# Patient Record
Sex: Male | Born: 1945 | Race: White | Hispanic: No | Smoking: Former smoker
Health system: Southern US, Community
[De-identification: ages and names within clinical notes are randomized; demographics above are authoritative.]

## PROBLEM LIST (undated history)

## (undated) DIAGNOSIS — R002 Palpitations: Secondary | ICD-10-CM

## (undated) DIAGNOSIS — M1990 Unspecified osteoarthritis, unspecified site: Secondary | ICD-10-CM

## (undated) DIAGNOSIS — I10 Essential (primary) hypertension: Secondary | ICD-10-CM

## (undated) DIAGNOSIS — F329 Major depressive disorder, single episode, unspecified: Secondary | ICD-10-CM

## (undated) DIAGNOSIS — E7800 Pure hypercholesterolemia, unspecified: Secondary | ICD-10-CM

## (undated) DIAGNOSIS — F32A Depression, unspecified: Secondary | ICD-10-CM

## (undated) HISTORY — DX: Palpitations: R00.2

## (undated) HISTORY — PX: VASECTOMY REVERSAL: SHX243

## (undated) HISTORY — PX: VASECTOMY: SHX75

## (undated) HISTORY — DX: Gilbert syndrome: E80.4

---

## 2001-06-25 ENCOUNTER — Other Ambulatory Visit: Admission: RE | Admit: 2001-06-25 | Discharge: 2001-06-25 | Payer: Self-pay | Admitting: Family Medicine

## 2001-06-25 ENCOUNTER — Encounter: Payer: Self-pay | Admitting: Family Medicine

## 2001-06-25 ENCOUNTER — Ambulatory Visit (HOSPITAL_COMMUNITY): Admission: RE | Admit: 2001-06-25 | Discharge: 2001-06-25 | Payer: Self-pay | Admitting: Family Medicine

## 2001-08-23 ENCOUNTER — Other Ambulatory Visit: Admission: RE | Admit: 2001-08-23 | Discharge: 2001-08-23 | Payer: Self-pay | Admitting: Dermatology

## 2002-05-26 HISTORY — DX: Gilbert syndrome: E80.4

## 2002-10-04 ENCOUNTER — Emergency Department (HOSPITAL_COMMUNITY): Admission: EM | Admit: 2002-10-04 | Discharge: 2002-10-04 | Payer: Self-pay | Admitting: *Deleted

## 2003-05-04 ENCOUNTER — Other Ambulatory Visit: Admission: RE | Admit: 2003-05-04 | Discharge: 2003-05-04 | Payer: Self-pay | Admitting: Dermatology

## 2003-07-10 ENCOUNTER — Emergency Department (HOSPITAL_COMMUNITY): Admission: EM | Admit: 2003-07-10 | Discharge: 2003-07-10 | Payer: Self-pay | Admitting: Emergency Medicine

## 2006-01-25 ENCOUNTER — Emergency Department (HOSPITAL_COMMUNITY): Admission: EM | Admit: 2006-01-25 | Discharge: 2006-01-25 | Payer: Self-pay | Admitting: Emergency Medicine

## 2009-05-28 ENCOUNTER — Ambulatory Visit (HOSPITAL_COMMUNITY): Admission: RE | Admit: 2009-05-28 | Discharge: 2009-05-28 | Payer: Self-pay | Admitting: Family Medicine

## 2009-06-05 ENCOUNTER — Ambulatory Visit (HOSPITAL_COMMUNITY): Admission: RE | Admit: 2009-06-05 | Discharge: 2009-06-05 | Payer: Self-pay | Admitting: Family Medicine

## 2011-05-30 ENCOUNTER — Ambulatory Visit (HOSPITAL_COMMUNITY)
Admission: RE | Admit: 2011-05-30 | Discharge: 2011-05-30 | Disposition: A | Discharge status: Home / Self Care | Payer: Medicare Other | Source: Ambulatory Visit | Attending: Family Medicine | Admitting: Family Medicine

## 2011-05-30 ENCOUNTER — Other Ambulatory Visit: Payer: Self-pay | Admitting: Family Medicine

## 2011-05-30 DIAGNOSIS — M25562 Pain in left knee: Secondary | ICD-10-CM

## 2011-05-30 DIAGNOSIS — M25569 Pain in unspecified knee: Secondary | ICD-10-CM | POA: Insufficient documentation

## 2011-06-07 ENCOUNTER — Emergency Department (HOSPITAL_COMMUNITY): Payer: Medicare Other

## 2011-06-07 ENCOUNTER — Encounter (HOSPITAL_COMMUNITY): Payer: Self-pay | Admitting: Emergency Medicine

## 2011-06-07 ENCOUNTER — Emergency Department (HOSPITAL_COMMUNITY)
Admission: EM | Admit: 2011-06-07 | Discharge: 2011-06-07 | Disposition: A | Discharge status: Home / Self Care | Payer: Medicare Other | Attending: Emergency Medicine | Admitting: Emergency Medicine

## 2011-06-07 DIAGNOSIS — M79672 Pain in left foot: Secondary | ICD-10-CM

## 2011-06-07 DIAGNOSIS — E7800 Pure hypercholesterolemia, unspecified: Secondary | ICD-10-CM | POA: Insufficient documentation

## 2011-06-07 DIAGNOSIS — Z862 Personal history of diseases of the blood and blood-forming organs and certain disorders involving the immune mechanism: Secondary | ICD-10-CM | POA: Insufficient documentation

## 2011-06-07 DIAGNOSIS — W19XXXA Unspecified fall, initial encounter: Secondary | ICD-10-CM

## 2011-06-07 DIAGNOSIS — S9030XA Contusion of unspecified foot, initial encounter: Secondary | ICD-10-CM | POA: Insufficient documentation

## 2011-06-07 DIAGNOSIS — F329 Major depressive disorder, single episode, unspecified: Secondary | ICD-10-CM | POA: Insufficient documentation

## 2011-06-07 DIAGNOSIS — M25562 Pain in left knee: Secondary | ICD-10-CM

## 2011-06-07 DIAGNOSIS — M7989 Other specified soft tissue disorders: Secondary | ICD-10-CM | POA: Insufficient documentation

## 2011-06-07 DIAGNOSIS — F3289 Other specified depressive episodes: Secondary | ICD-10-CM | POA: Insufficient documentation

## 2011-06-07 DIAGNOSIS — M25569 Pain in unspecified knee: Secondary | ICD-10-CM | POA: Insufficient documentation

## 2011-06-07 DIAGNOSIS — Z8639 Personal history of other endocrine, nutritional and metabolic disease: Secondary | ICD-10-CM | POA: Insufficient documentation

## 2011-06-07 DIAGNOSIS — J45909 Unspecified asthma, uncomplicated: Secondary | ICD-10-CM | POA: Insufficient documentation

## 2011-06-07 DIAGNOSIS — W1809XA Striking against other object with subsequent fall, initial encounter: Secondary | ICD-10-CM | POA: Insufficient documentation

## 2011-06-07 DIAGNOSIS — I10 Essential (primary) hypertension: Secondary | ICD-10-CM | POA: Insufficient documentation

## 2011-06-07 DIAGNOSIS — M79609 Pain in unspecified limb: Secondary | ICD-10-CM | POA: Insufficient documentation

## 2011-06-07 HISTORY — DX: Major depressive disorder, single episode, unspecified: F32.9

## 2011-06-07 HISTORY — DX: Depression, unspecified: F32.A

## 2011-06-07 HISTORY — DX: Essential (primary) hypertension: I10

## 2011-06-07 HISTORY — DX: Pure hypercholesterolemia, unspecified: E78.00

## 2011-06-07 MED ORDER — IBUPROFEN 800 MG PO TABS: 800.0000 mg | ORAL_TABLET | Freq: Three times a day (TID) | ORAL | Status: AC

## 2011-06-07 NOTE — ED Provider Notes (Signed)
History  Scribed for Donnetta Hutching, MD, the patient was seen in [facility]. The chart was scribed by Gilman Schmidt. The patients care was started at 11:35 AM.   CSN: [id number]  Arrival date & time 06/07/11  1016   First MD Initiated Contact with Patient 06/07/11 1043      Chief Complaint  Patient presents with  . Fall  . Foot Pain    (Consider location/radiation/quality/duration/timing/severity/associated sxs/prior treatment) HPI Travis Schmidt is a 66 y.o. male who presents to the Emergency Department complaining of left foot pain and swelling. Pt states he was sent by PCP to get XR. Reports that last night he was getting ready for bed and his knee gave out. States he then slammed left foot against wall. There are no other associated symptoms and no other alleviating or aggravating factors.   Past Medical History  Diagnosis Date  . Asthma   . Depression   . High cholesterol   . Hypertension   . Gout     Past Surgical History  Procedure Date  . Vasectomy   . Vasectomy reversal     Family History  Problem Relation Age of Onset  . Stroke Mother   . COPD Father   . Stroke Other     History  Substance Use Topics  . Smoking status: Current Some Day Smoker    Types: Cigars  . Smokeless tobacco: Never Used  . Alcohol Use: 6.0 oz/week    10 Cans of beer per week      Review of Systems  Musculoskeletal:       Foot Pain Foot Swelling  Skin: Negative for wound.  All other systems reviewed and are negative.    Allergies  Codeine and Penicillins  Home Medications  No current outpatient prescriptions on file.  BP 135/73  Pulse 82  Temp(Src) 97 F (36.1 C) (Oral)  Resp 21  Ht 6' (1.829 m)  Wt 220 lb (99.791 kg)  BMI 29.84 kg/m2  SpO2 97%  Physical Exam  Constitutional: He is oriented to person, place, and time. He appears well-developed and well-nourished.  Non-toxic appearance. He does not have a sickly appearance.  HENT:  Head: Normocephalic and  atraumatic.  Eyes: Lids are normal.  Neck: Trachea normal and full passive range of motion without pain.  Cardiovascular: Regular rhythm and normal heart sounds.   Pulmonary/Chest: Effort normal and breath sounds normal. No respiratory distress.  Abdominal: Soft. Normal appearance. He exhibits no distension. There is no tenderness. There is no rebound and no CVA tenderness.  Musculoskeletal: Normal range of motion.       Tenderness to right dorsum of foot Puffy and ecchymotic    Neurological: He is alert and oriented to person, place, and time. He has normal strength.  Skin: Skin is warm, dry and intact. No rash noted.  Psychiatric: He has a normal mood and affect.    ED Course  Procedures (including critical care time)  Labs Reviewed - No data to display No results found.   No diagnosis found.  DIAGNOSTIC STUDIES: Oxygen Saturation is 97% on room air, normal by my interpretation.    Radiology: DG Knee Complete 4 View. Reviewed by me. IMPRESSION: No acute finding. Original Report Authenticated By: Bernadene Bell. D'ALESSIO, M.D.  DG Foot Complete Left. Reviewed by me. IMPRESSION:  1. Soft tissue swelling. 2. No evidence for acute fracture.  Original Report Authenticated By: Patterson Hammersmith, M.D.     COORDINATION OF CARE: 11:35am:  -  Patient evaluated by ED physician,     MDM   X-ray of left foot and left knee were negative.  Knee immobilizer, firm sneaker for foot.  I personally performed the services described in this documentation, which was scribed in my presence. The recorded information has been reviewed and considered.        Donnetta Hutching, MD 06/07/11 1526

## 2011-06-07 NOTE — ED Notes (Signed)
Patient c/o left foot pain. Per patient fell yesterday and hit foot into wall.

## 2012-02-14 ENCOUNTER — Emergency Department (HOSPITAL_COMMUNITY): Payer: Medicare Other

## 2012-02-14 ENCOUNTER — Emergency Department (HOSPITAL_COMMUNITY)
Admission: EM | Admit: 2012-02-14 | Discharge: 2012-02-14 | Disposition: A | Discharge status: Home / Self Care | Payer: Medicare Other | Attending: Emergency Medicine | Admitting: Emergency Medicine

## 2012-02-14 ENCOUNTER — Encounter (HOSPITAL_COMMUNITY): Payer: Self-pay | Admitting: *Deleted

## 2012-02-14 DIAGNOSIS — F17200 Nicotine dependence, unspecified, uncomplicated: Secondary | ICD-10-CM | POA: Insufficient documentation

## 2012-02-14 DIAGNOSIS — W19XXXA Unspecified fall, initial encounter: Secondary | ICD-10-CM

## 2012-02-14 DIAGNOSIS — I10 Essential (primary) hypertension: Secondary | ICD-10-CM | POA: Insufficient documentation

## 2012-02-14 DIAGNOSIS — S32000A Wedge compression fracture of unspecified lumbar vertebra, initial encounter for closed fracture: Secondary | ICD-10-CM

## 2012-02-14 DIAGNOSIS — M109 Gout, unspecified: Secondary | ICD-10-CM | POA: Insufficient documentation

## 2012-02-14 DIAGNOSIS — S32009A Unspecified fracture of unspecified lumbar vertebra, initial encounter for closed fracture: Secondary | ICD-10-CM | POA: Insufficient documentation

## 2012-02-14 DIAGNOSIS — E7800 Pure hypercholesterolemia, unspecified: Secondary | ICD-10-CM | POA: Insufficient documentation

## 2012-02-14 DIAGNOSIS — W108XXA Fall (on) (from) other stairs and steps, initial encounter: Secondary | ICD-10-CM | POA: Insufficient documentation

## 2012-02-14 MED ORDER — OXYCODONE-ACETAMINOPHEN 5-325 MG PO TABS: 1.0000 | ORAL_TABLET | Freq: Four times a day (QID) | ORAL | Status: DC | PRN

## 2012-02-14 MED ORDER — HYDROMORPHONE HCL PF 2 MG/ML IJ SOLN
2.0000 mg | Freq: Once | INTRAMUSCULAR | Status: AC
  Administered 2012-02-14: 2 mg via INTRAMUSCULAR
  Filled 2012-02-14: qty 1

## 2012-02-14 MED ORDER — TETANUS-DIPHTH-ACELL PERTUSSIS 5-2.5-18.5 LF-MCG/0.5 IM SUSP
0.5000 mL | Freq: Once | INTRAMUSCULAR | Status: AC
  Administered 2012-02-14: 0.5 mL via INTRAMUSCULAR
  Filled 2012-02-14: qty 0.5

## 2012-02-14 MED ORDER — OXYCODONE-ACETAMINOPHEN 5-325 MG PO TABS: 2.0000 | ORAL_TABLET | ORAL | Status: DC | PRN

## 2012-02-14 NOTE — ED Notes (Addendum)
Pt fell on wet steps while coming down steps, pt states that he rolled down 11 steps, c/o pain to lower back, bilateral leg pain.

## 2012-02-14 NOTE — ED Notes (Signed)
Pt states fell down 11 stairs after slipping on wet deck steps. Pt presents with lower back pain, bilateral elbow abrasions and left shoulder abrasion. No bleeding noted. No deformities noted.  Pt ambulated to ED from home that is a short distance away without difficulty. Full ROM noted in all 4 extremities. Pulses equal and present x 4. Pt is alert, oriented x 4,

## 2012-02-14 NOTE — ED Provider Notes (Signed)
History   This chart was scribed for Shelda Jakes, MD scribed by Magnus Sinning. The patient was seen in room [facility] at 17:14   CSN: [id number]  Arrival date & time 02/14/12  1650   Chief Complaint  Patient presents with  . Fall    (Consider location/radiation/quality/duration/timing/severity/associated sxs/prior treatment) HPI  NYMIR RINGLER II is a 66 y.o. male who presents to the Emergency Department for EVAL following a fall that occurred this afternoon. Pt states he was heading into his home when he slipped and fell backwards onto his back. He says he slid down 11 wooden stairs causing pain to his bilateral lower mid back. Pt reports associated leg weakness, but says he ambulated to the ED with his cane, as he lives nearby. Patient rates current back pain a 9/10. He denies LOC, n/v, HA, chest pain, or abd pain. Pt reports hx of fall approximately 9 years ago in his home that caused back injury. He says Dr. Eduard Clos treated him with improvement. Patient says that he began having back pain at same location two weeks ago, which he says was improved with physical therapy equipment used in his home.  Patient unsure if tetanus is UTD.  Past Medical History  Diagnosis Date  . Depression   . High cholesterol   . Hypertension   . Gout     Past Surgical History  Procedure Date  . Vasectomy   . Vasectomy reversal     Family History  Problem Relation Age of Onset  . Stroke Mother   . COPD Father   . Stroke Other     History  Substance Use Topics  . Smoking status: Current Some Day Smoker    Types: Cigars  . Smokeless tobacco: Never Used  . Alcohol Use: 6.0 oz/week    10 Cans of beer per week      Review of Systems  Cardiovascular: Negative for chest pain.  Gastrointestinal: Negative for nausea, vomiting and abdominal pain.  Musculoskeletal: Positive for back pain.  Neurological: Negative for syncope and headaches.  All other systems reviewed and are  negative.    Allergies  Codeine and Penicillins  Home Medications   Current Outpatient Rx  Name Route Sig Dispense Refill  . ALPRAZOLAM 1 MG PO TABS Oral Take 1 mg by mouth at bedtime.    Marland Kitchen LIPITOR PO Oral Take 1 tablet by mouth every evening.     Marland Kitchen LEXAPRO PO Oral Take 1 tablet by mouth daily.     . TOPROL XL PO Oral Take 1 tablet by mouth daily.     . SEROQUEL PO Oral Take 1 capsule by mouth at bedtime.     . OXYCODONE-ACETAMINOPHEN 5-325 MG PO TABS Oral Take 2 tablets by mouth every 4 (four) hours as needed for pain. 6 tablet 0  . OXYCODONE-ACETAMINOPHEN 5-325 MG PO TABS Oral Take 1-2 tablets by mouth every 6 (six) hours as needed for pain. 15 tablet 0    BP 141/72  Pulse 79  Temp 98.7 F (37.1 C)  Resp 20  SpO2 97%  Physical Exam  Nursing note and vitals reviewed. Constitutional: He is oriented to person, place, and time. He appears well-developed and well-nourished. No distress.  HENT:  Head: Normocephalic and atraumatic.  Eyes: Conjunctivae normal and EOM are normal.  Neck: Neck supple. No tracheal deviation present.  Cardiovascular: Normal rate and regular rhythm.   No murmur heard. Pulmonary/Chest: Effort normal. No respiratory distress.  Lungs clear  Abdominal: Soft. Bowel sounds are normal. He exhibits no distension. There is no tenderness.  Musculoskeletal: Normal range of motion. He exhibits no edema.       Bilateral paraspinal tenderness  Neurological: He is alert and oriented to person, place, and time. No sensory deficit.  Skin: Skin is warm and dry.       Abrasions to back of both left and right elbows. Abrasions to both knees. No marks noted on the back.  Psychiatric: He has a normal mood and affect. His behavior is normal.    ED Course  Procedures (including critical care time) DIAGNOSTIC STUDIES: Oxygen Saturation is 97% on room airn, normal by my interpretation.    COORDINATION OF CARE:  17:20 Physical exam performed.  Medication  Orders 1730:TDaP (BOOSTRIX) injection 0.5 mL            HYDROmorphone (DILAUDID) injection 2 mg  Dg Lumbar Spine Complete  02/14/2012  *RADIOLOGY REPORT*  Clinical Data: Fall, low back pain  LUMBAR SPINE - COMPLETE 4+ VIEW  Comparison: Lumbar spine MRI dated 06/05/2009  Findings: Five lumbar-type vertebral bodies.  Normal lumbar lordosis.  Mild superior endplate compression deformity at L1, age indeterminate, but new from 2011.  No retropulsion.  No additional fracture is seen.  Mild multilevel degenerative changes.  Visualized bony pelvis appears intact.  IMPRESSION: Mild superior endplate compression deformity at L1, age indeterminate, but new from 2011.  Correlate with the site of the patient's pain to exclude acute fracture.   Original Report Authenticated By: Charline Bills, M.D.      1. Fall   2. Lumbar compression fracture       MDM  Workup consistent with lumbar compression fracture new since 2001 and may be related to this fall. Patient does have pain in that general area will treat with pain medicine and have patient continue to followup with Dr. Eduard Clos in spine clinic.  No significant neurological deficits.    I personally performed the services described in this documentation, which was scribed in my presence. The recorded information has been reviewed and considered.          Shelda Jakes, MD 02/14/12 [phone]

## 2012-02-16 ENCOUNTER — Other Ambulatory Visit (HOSPITAL_COMMUNITY): Payer: Self-pay | Admitting: Physical Medicine and Rehabilitation

## 2012-02-16 ENCOUNTER — Ambulatory Visit (HOSPITAL_COMMUNITY)
Admission: RE | Admit: 2012-02-16 | Discharge: 2012-02-16 | Disposition: A | Discharge status: Home / Self Care | Payer: Medicare Other | Source: Ambulatory Visit | Attending: Physical Medicine and Rehabilitation | Admitting: Physical Medicine and Rehabilitation

## 2012-02-16 DIAGNOSIS — IMO0002 Reserved for concepts with insufficient information to code with codable children: Secondary | ICD-10-CM

## 2012-02-16 DIAGNOSIS — S32009A Unspecified fracture of unspecified lumbar vertebra, initial encounter for closed fracture: Secondary | ICD-10-CM | POA: Insufficient documentation

## 2012-02-16 DIAGNOSIS — M545 Low back pain, unspecified: Secondary | ICD-10-CM | POA: Insufficient documentation

## 2012-02-16 DIAGNOSIS — M5126 Other intervertebral disc displacement, lumbar region: Secondary | ICD-10-CM | POA: Insufficient documentation

## 2012-02-16 DIAGNOSIS — M8440XA Pathological fracture, unspecified site, initial encounter for fracture: Secondary | ICD-10-CM

## 2012-02-16 DIAGNOSIS — W19XXXA Unspecified fall, initial encounter: Secondary | ICD-10-CM | POA: Insufficient documentation

## 2012-02-16 MED FILL — Oxycodone w/ Acetaminophen Tab 5-325 MG: ORAL | Qty: 6 | Status: AC

## 2012-05-31 ENCOUNTER — Encounter (HOSPITAL_COMMUNITY): Payer: Self-pay | Admitting: *Deleted

## 2012-05-31 ENCOUNTER — Emergency Department (HOSPITAL_COMMUNITY)
Admission: EM | Admit: 2012-05-31 | Discharge: 2012-05-31 | Disposition: A | Discharge status: Home / Self Care | Payer: Medicare PPO | Attending: Emergency Medicine | Admitting: Emergency Medicine

## 2012-05-31 DIAGNOSIS — F10929 Alcohol use, unspecified with intoxication, unspecified: Secondary | ICD-10-CM

## 2012-05-31 DIAGNOSIS — Z79899 Other long term (current) drug therapy: Secondary | ICD-10-CM | POA: Insufficient documentation

## 2012-05-31 DIAGNOSIS — F17200 Nicotine dependence, unspecified, uncomplicated: Secondary | ICD-10-CM | POA: Insufficient documentation

## 2012-05-31 DIAGNOSIS — E7800 Pure hypercholesterolemia, unspecified: Secondary | ICD-10-CM | POA: Insufficient documentation

## 2012-05-31 DIAGNOSIS — F329 Major depressive disorder, single episode, unspecified: Secondary | ICD-10-CM | POA: Insufficient documentation

## 2012-05-31 DIAGNOSIS — I10 Essential (primary) hypertension: Secondary | ICD-10-CM | POA: Insufficient documentation

## 2012-05-31 DIAGNOSIS — M109 Gout, unspecified: Secondary | ICD-10-CM | POA: Insufficient documentation

## 2012-05-31 DIAGNOSIS — F3289 Other specified depressive episodes: Secondary | ICD-10-CM | POA: Insufficient documentation

## 2012-05-31 DIAGNOSIS — F1010 Alcohol abuse, uncomplicated: Secondary | ICD-10-CM | POA: Insufficient documentation

## 2012-05-31 LAB — BASIC METABOLIC PANEL
BUN: 5 mg/dL — ABNORMAL LOW (ref 6–23)
CO2: 30 mEq/L (ref 19–32)
Calcium: 8.9 mg/dL (ref 8.4–10.5)
Chloride: 101 mEq/L (ref 96–112)
Creatinine, Ser: 0.9 mg/dL (ref 0.50–1.35)
GFR calc Af Amer: >90 mL/min (ref >90)
Potassium: 3.5 mEq/L (ref 3.5–5.1)

## 2012-05-31 LAB — RAPID URINE DRUG SCREEN, HOSP PERFORMED: Tetrahydrocannabinol: NOT DETECTED

## 2012-05-31 LAB — CBC
MCV: 103.9 fL — ABNORMAL HIGH (ref 78.0–100.0)
Platelets: 132 10*3/uL — ABNORMAL LOW (ref 150–400)
RBC: 4.08 MIL/uL — ABNORMAL LOW (ref 4.22–5.81)
WBC: 6.3 10*3/uL (ref 4.0–10.5)

## 2012-05-31 MED ORDER — VITAMIN B-1 100 MG PO TABS
100.0000 mg | ORAL_TABLET | Freq: Once | ORAL | Status: AC
  Administered 2012-05-31: 100 mg via ORAL
  Filled 2012-05-31: qty 1

## 2012-05-31 MED ORDER — FOLIC ACID 1 MG PO TABS
1.0000 mg | ORAL_TABLET | Freq: Once | ORAL | Status: AC
  Administered 2012-05-31: 1 mg via ORAL
  Filled 2012-05-31: qty 1

## 2012-05-31 MED ORDER — ADULT MULTIVITAMIN W/MINERALS CH
1.0000 | ORAL_TABLET | Freq: Once | ORAL | Status: AC
  Administered 2012-05-31: 1 via ORAL
  Filled 2012-05-31: qty 1

## 2012-05-31 NOTE — ED Notes (Signed)
Pt discharged. Pt stable at time of discharge. pt has no questions regarding discharge at this time. Pt voiced understanding of discharge instructions.  

## 2012-05-31 NOTE — ED Notes (Signed)
Contact numbers,  Asher Muir [phone] Erskine Squibb [phone]   Call for status reports.

## 2012-05-31 NOTE — ED Notes (Signed)
Pt arrived from home via ems d/t alcohol intoxication. Pt states he was sitting at home having a drink and his family was concerned pt was in danger. Pt arrives alert and aware. Pt answers all questions properly c/o of chronic back pain. No other complaints at this time.

## 2012-05-31 NOTE — ED Provider Notes (Signed)
History     CSN: [id number]  Arrival date & time 05/31/12  7829   First MD Initiated Contact with Patient 05/31/12 [phone]      Chief Complaint  Patient presents with  . Alcohol Intoxication    (Consider location/radiation/quality/duration/timing/severity/associated sxs/prior treatment) HPI Travis Schmidt is a 67 y.o. male brought in by ambulance, who presents to the Emergency Department complaining of alcohol abuse. EMS was called to the home due to an accidental pushing of a first alert button. Patient admitted to drinking and had pushed the button. He did not want to be evaluated at the hospital. His son, who is on the call list for first alert, called EMS to go to the home and pick up the patient and bring him to the ER to be checked. Both visits to the home required that EMS break into the home. The patient was reluctant to come to the ER. He admits to drinking "several" glasses of wine and taking his pain medicine. He does not want help with his drinking problem. He is not suicidal, homicidal and does not have AVH. Past Medical History  Diagnosis Date  . Depression   . High cholesterol   . Hypertension   . Gout     Past Surgical History  Procedure Date  . Vasectomy   . Vasectomy reversal     Family History  Problem Relation Age of Onset  . Stroke Mother   . COPD Father   . Stroke Other     History  Substance Use Topics  . Smoking status: Current Some Day Smoker    Types: Cigars  . Smokeless tobacco: Never Used  . Alcohol Use: 6.0 oz/week    10 Cans of beer per week      Review of Systems  Constitutional: Negative for fever.       10 Systems reviewed and are negative for acute change except as noted in the HPI.  HENT: Negative for congestion.   Eyes: Negative for discharge and redness.  Respiratory: Negative for cough and shortness of breath.   Cardiovascular: Negative for chest pain.  Gastrointestinal: Negative for vomiting and abdominal pain.    Musculoskeletal: Positive for back pain.  Skin: Negative for rash.  Neurological: Negative for syncope, numbness and headaches.  Psychiatric/Behavioral:       No behavior change.    Allergies  Codeine and Penicillins  Home Medications   Current Outpatient Rx  Name  Route  Sig  Dispense  Refill  . ALPRAZOLAM 1 MG PO TABS   Oral   Take 1 mg by mouth at bedtime.         Marland Kitchen LIPITOR PO   Oral   Take 1 tablet by mouth every evening.          Marland Kitchen LEXAPRO PO   Oral   Take 1 tablet by mouth daily.          . TOPROL XL PO   Oral   Take 1 tablet by mouth daily.          . OXYCODONE-ACETAMINOPHEN 5-325 MG PO TABS   Oral   Take 1-2 tablets by mouth every 6 (six) hours as needed for pain.   15 tablet   0   . OXYCODONE-ACETAMINOPHEN 5-325 MG PO TABS   Oral   Take 1-2 tablets by mouth every 6 (six) hours as needed for pain.   6 tablet   0   . SEROQUEL PO   Oral  Take 1 capsule by mouth at bedtime.            BP 127/69  Pulse 71  Temp 97.6 F (36.4 C) (Oral)  Resp 18  Ht 6' (1.829 m)  Wt 220 lb (99.791 kg)  BMI 29.84 kg/m2  SpO2 95%  Physical Exam  Nursing note and vitals reviewed. Constitutional: He is oriented to person, place, and time.       Awake, alert,disheveled, elderly man, intoxicated  HENT:  Head: Atraumatic.  Eyes: Right eye exhibits no discharge. Left eye exhibits no discharge.  Neck: Neck supple.  Cardiovascular: Normal heart sounds.   Pulmonary/Chest: Effort normal and breath sounds normal. He exhibits no tenderness.  Abdominal: Soft. There is no tenderness. There is no rebound.  Musculoskeletal: He exhibits no tenderness.       Baseline ROM, no obvious new focal weakness.  Neurological: He is alert and oriented to person, place, and time. He has normal reflexes. No cranial nerve deficit.       Mental status and motor strength appears baseline for patient and situation.  Skin: No rash noted.  Psychiatric: He has a normal mood and  affect.    ED Course  Procedures (including critical care time)  Results for orders placed during the hospital encounter of 05/31/12  ETHANOL      Component Value Range   Alcohol, Ethyl (B) 174 (*) 0 - 11 mg/dL  BASIC METABOLIC PANEL      Component Value Range   Sodium 140  135 - 145 mEq/L   Potassium 3.5  3.5 - 5.1 mEq/L   Chloride 101  96 - 112 mEq/L   CO2 30  19 - 32 mEq/L   Glucose, Bld 103 (*) 70 - 99 mg/dL   BUN 5 (*) 6 - 23 mg/dL   Creatinine, Ser 1.61  0.50 - 1.35 mg/dL   Calcium 8.9  8.4 - 09.6 mg/dL   GFR calc non Af Amer 85 (*) >90 mL/min   GFR calc Af Amer >90  >90 mL/min  CBC      Component Value Range   WBC 6.3  4.0 - 10.5 K/uL   RBC 4.08 (*) 4.22 - 5.81 MIL/uL   Hemoglobin 14.8  13.0 - 17.0 g/dL   HCT 04.5  40.9 - 81.1 %   MCV 103.9 (*) 78.0 - 100.0 fL   MCH 36.3 (*) 26.0 - 34.0 pg   MCHC 34.9  30.0 - 36.0 g/dL   RDW 91.4  78.2 - 95.6 %   Platelets 132 (*) 150 - 400 K/uL  URINE RAPID DRUG SCREEN (HOSP PERFORMED)      Component Value Range   Opiates NONE DETECTED  NONE DETECTED   Cocaine NONE DETECTED  NONE DETECTED   Benzodiazepines NONE DETECTED  NONE DETECTED   Amphetamines NONE DETECTED  NONE DETECTED   Tetrahydrocannabinol NONE DETECTED  NONE DETECTED   Barbiturates NONE DETECTED  NONE DETECTED       MDM  Patient presents with alcohol intoxication, no c/o. He is here at the urging of his son. He has no c/o. Labs are unremarkable. ETOH is 174. He does not want help with his alcohol use. He will be discharged home. Pt stable in ED with no significant deterioration in condition.The patient appears reasonably screened and/or stabilized for discharge and I doubt any other medical condition or other [facility] requiring further screening, evaluation, or treatment in the ED at this time prior to discharge.  MDM Reviewed: nursing  note and vitals Interpretation: labs           Nicoletta Dress. Colon Branch, MD 05/31/12 [phone]

## 2012-11-18 ENCOUNTER — Other Ambulatory Visit: Payer: Self-pay | Admitting: Family Medicine

## 2012-11-18 NOTE — Telephone Encounter (Signed)
Needs office visit.

## 2012-11-22 ENCOUNTER — Encounter: Payer: Self-pay | Admitting: *Deleted

## 2012-12-02 ENCOUNTER — Other Ambulatory Visit: Payer: Self-pay | Admitting: Family Medicine

## 2013-03-14 ENCOUNTER — Other Ambulatory Visit: Payer: Self-pay | Admitting: Family Medicine

## 2013-03-14 NOTE — Telephone Encounter (Signed)
May refill x1. This patient also needs lab work. He also needs followup office visit. Please let me see his chart and I will inform the nurses what to order

## 2013-04-26 ENCOUNTER — Other Ambulatory Visit: Payer: Self-pay | Admitting: Family Medicine

## 2013-05-14 ENCOUNTER — Other Ambulatory Visit: Payer: Self-pay | Admitting: Family Medicine

## 2013-05-31 ENCOUNTER — Other Ambulatory Visit: Payer: Self-pay | Admitting: Family Medicine

## 2013-06-01 ENCOUNTER — Encounter: Payer: Self-pay | Admitting: Family Medicine

## 2013-06-01 ENCOUNTER — Ambulatory Visit (INDEPENDENT_AMBULATORY_CARE_PROVIDER_SITE_OTHER): Payer: Medicare PPO | Admitting: Family Medicine

## 2013-06-01 VITALS — BP 110/64 | HR 70 | Ht 71.0 in | Wt 227.0 lb

## 2013-06-01 DIAGNOSIS — Z0000 Encounter for general adult medical examination without abnormal findings: Secondary | ICD-10-CM

## 2013-06-01 DIAGNOSIS — Z79899 Other long term (current) drug therapy: Secondary | ICD-10-CM

## 2013-06-01 DIAGNOSIS — M2550 Pain in unspecified joint: Secondary | ICD-10-CM

## 2013-06-01 DIAGNOSIS — E782 Mixed hyperlipidemia: Secondary | ICD-10-CM

## 2013-06-01 DIAGNOSIS — M549 Dorsalgia, unspecified: Secondary | ICD-10-CM

## 2013-06-01 DIAGNOSIS — R7301 Impaired fasting glucose: Secondary | ICD-10-CM

## 2013-06-01 MED ORDER — DICLOFENAC SODIUM 75 MG PO TBEC: 75.0000 mg | DELAYED_RELEASE_TABLET | Freq: Two times a day (BID) | ORAL | Status: DC

## 2013-06-01 NOTE — Progress Notes (Signed)
   Subjective:    Patient ID: Travis Schmidt, male    DOB: [birth year], 68 y.o.   MRN: [id number]  HPI AWV- Annual Wellness Visit  The patient was seen for their annual wellness visit. The patient's past medical history, surgical history, and family history were reviewed. Pertinent vaccines were reviewed ( tetanus, pneumonia, shingles, flu) The patient's medication list was reviewed and updated. The height and weight were entered. The patient's current BMI is: 31.66  Cognitive screening was completed. Outcome of Mini - Cog: Pass  Falls within the past 6 months: None Current tobacco usage: cigars some days (All patients who use tobacco were given written and verbal information on quitting)  Recent listing of emergency department/hospitalizations over the past year were reviewed.  current specialist the patient sees on a regular basis: ER record was reviewed please see electronic record  Medicare annual wellness visit patient questionnaire was reviewed.  A written screening schedule for the patient for the next 5-10 years was given. Appropriate discussion of followup regarding next visit was discussed.       Review of Systems  Constitutional: Negative for fever, activity change and appetite change.  HENT: Negative for congestion and rhinorrhea.   Eyes: Negative for discharge.  Respiratory: Negative for cough and wheezing.   Cardiovascular: Negative for chest pain.  Gastrointestinal: Negative for vomiting, abdominal pain and blood in stool.  Genitourinary: Negative for frequency and difficulty urinating.  Musculoskeletal: Negative for neck pain.  Skin: Negative for rash.  Allergic/Immunologic: Negative for environmental allergies and food allergies.  Neurological: Negative for weakness and headaches.  Psychiatric/Behavioral: Negative for agitation.       Objective:   Physical Exam  Constitutional: He appears well-developed and well-nourished.  HENT:  Head: Normocephalic  and atraumatic.  Right Ear: External ear normal.  Left Ear: External ear normal.  Nose: Nose normal.  Mouth/Throat: Oropharynx is clear and moist.  Neck: Normal range of motion. Neck supple. No thyromegaly present.  Cardiovascular: Normal rate, regular rhythm and normal heart sounds.   No murmur heard. Pulmonary/Chest: Effort normal and breath sounds normal. No respiratory distress. He has no wheezes.  Abdominal: Soft. Bowel sounds are normal. He exhibits no distension and no mass. There is no tenderness.  Musculoskeletal: Normal range of motion. He exhibits no edema.  Lymphadenopathy:    He has no cervical adenopathy.  Neurological: He is alert. He exhibits normal muscle tone.  Skin: Skin is warm and dry. No erythema.  Psychiatric: He has a normal mood and affect. His behavior is normal. Judgment normal.          Assessment & Plan:  #1 back arthralgia-x-rays ordered. Try Voltaren twice a day if this does not help we might consider Mobic. He will let us know in a few weeks #2 wellness-patient defers on prostate exam and colonoscopy. He states he is very comfortable with knowing that he has lived a good life and he does not want to have any type of cancer detection tests or other preventative measures done at this time #3 hyperlipidemia -- will monitor her lipid liver profile is yearly #4 patient will followup in 6 months regarding cholesterol

## 2013-06-18 ENCOUNTER — Emergency Department (HOSPITAL_COMMUNITY)
Admission: EM | Admit: 2013-06-18 | Discharge: 2013-06-18 | Disposition: A | Discharge status: Home / Self Care | Payer: Medicare PPO | Attending: Emergency Medicine | Admitting: Emergency Medicine

## 2013-06-18 ENCOUNTER — Emergency Department (HOSPITAL_COMMUNITY): Payer: Medicare PPO

## 2013-06-18 ENCOUNTER — Encounter (HOSPITAL_COMMUNITY): Payer: Self-pay | Admitting: Emergency Medicine

## 2013-06-18 DIAGNOSIS — F17200 Nicotine dependence, unspecified, uncomplicated: Secondary | ICD-10-CM | POA: Insufficient documentation

## 2013-06-18 DIAGNOSIS — F329 Major depressive disorder, single episode, unspecified: Secondary | ICD-10-CM | POA: Insufficient documentation

## 2013-06-18 DIAGNOSIS — J111 Influenza due to unidentified influenza virus with other respiratory manifestations: Secondary | ICD-10-CM

## 2013-06-18 DIAGNOSIS — I10 Essential (primary) hypertension: Secondary | ICD-10-CM | POA: Insufficient documentation

## 2013-06-18 DIAGNOSIS — J9801 Acute bronchospasm: Secondary | ICD-10-CM | POA: Insufficient documentation

## 2013-06-18 DIAGNOSIS — Z79899 Other long term (current) drug therapy: Secondary | ICD-10-CM | POA: Insufficient documentation

## 2013-06-18 DIAGNOSIS — E7800 Pure hypercholesterolemia, unspecified: Secondary | ICD-10-CM | POA: Insufficient documentation

## 2013-06-18 DIAGNOSIS — Z791 Long term (current) use of non-steroidal anti-inflammatories (NSAID): Secondary | ICD-10-CM | POA: Insufficient documentation

## 2013-06-18 DIAGNOSIS — F3289 Other specified depressive episodes: Secondary | ICD-10-CM | POA: Insufficient documentation

## 2013-06-18 DIAGNOSIS — Z880 Allergy status to penicillin: Secondary | ICD-10-CM | POA: Insufficient documentation

## 2013-06-18 DIAGNOSIS — R062 Wheezing: Secondary | ICD-10-CM | POA: Insufficient documentation

## 2013-06-18 LAB — BASIC METABOLIC PANEL
BUN: 25 mg/dL — AB (ref 6–23)
CO2: 23 meq/L (ref 19–32)
Calcium: 9.2 mg/dL (ref 8.4–10.5)
Chloride: 103 mEq/L (ref 96–112)
Creatinine, Ser: 1.39 mg/dL — ABNORMAL HIGH (ref 0.50–1.35)
GFR calc Af Amer: 58 mL/min — ABNORMAL LOW (ref >90)
GFR calc non Af Amer: 50 mL/min — ABNORMAL LOW (ref >90)
GLUCOSE: 134 mg/dL — AB (ref 70–99)
POTASSIUM: 4.2 meq/L (ref 3.7–5.3)
Sodium: 138 mEq/L (ref 137–147)

## 2013-06-18 LAB — CBC WITH DIFFERENTIAL/PLATELET
BASOS PCT: 1 % (ref 0–1)
Basophils Absolute: 0 10*3/uL (ref 0.0–0.1)
EOS ABS: 0.1 10*3/uL (ref 0.0–0.7)
EOS PCT: 1 % (ref 0–5)
HCT: 41.6 % (ref 39.0–52.0)
Hemoglobin: 14.2 g/dL (ref 13.0–17.0)
Lymphocytes Relative: 13 % (ref 12–46)
Lymphs Abs: 0.6 10*3/uL — ABNORMAL LOW (ref 0.7–4.0)
MCH: 32.6 pg (ref 26.0–34.0)
MCHC: 34.1 g/dL (ref 30.0–36.0)
MCV: 95.4 fL (ref 78.0–100.0)
MONO ABS: 0.5 10*3/uL (ref 0.1–1.0)
Monocytes Relative: 10 % (ref 3–12)
Neutro Abs: 3.8 10*3/uL (ref 1.7–7.7)
Neutrophils Relative %: 75 % (ref 43–77)
Platelets: 131 10*3/uL — ABNORMAL LOW (ref 150–400)
RBC: 4.36 MIL/uL (ref 4.22–5.81)
RDW: 14.3 % (ref 11.5–15.5)
WBC: 5.1 10*3/uL (ref 4.0–10.5)

## 2013-06-18 LAB — CG4 I-STAT (LACTIC ACID): Lactic Acid, Venous: 0.82 mmol/L (ref 0.5–2.2)

## 2013-06-18 MED ORDER — PREDNISONE 50 MG PO TABS
60.0000 mg | ORAL_TABLET | Freq: Once | ORAL | Status: AC
  Administered 2013-06-18: 60 mg via ORAL
  Filled 2013-06-18 (×2): qty 1

## 2013-06-18 MED ORDER — ALBUTEROL SULFATE HFA 108 (90 BASE) MCG/ACT IN AERS: 2.0000 | INHALATION_SPRAY | RESPIRATORY_TRACT | Status: DC | PRN

## 2013-06-18 MED ORDER — PREDNISONE 20 MG PO TABS: ORAL_TABLET | ORAL | Status: DC

## 2013-06-18 MED ORDER — IPRATROPIUM-ALBUTEROL 0.5-2.5 (3) MG/3ML IN SOLN
3.0000 mL | RESPIRATORY_TRACT | Status: DC
Start: 2013-06-18 — End: 2013-06-18
  Administered 2013-06-18: 3 mL via RESPIRATORY_TRACT
  Filled 2013-06-18: qty 3

## 2013-06-18 MED ORDER — SODIUM CHLORIDE 0.9 % IV BOLUS (SEPSIS)
2000.0000 mL | Freq: Once | INTRAVENOUS | Status: AC
  Administered 2013-06-18: 2000 mL via INTRAVENOUS

## 2013-06-18 MED ORDER — SODIUM CHLORIDE 0.9 % IV BOLUS (SEPSIS): 1000.0000 mL | Freq: Once | INTRAVENOUS | Status: DC

## 2013-06-18 MED ORDER — SODIUM CHLORIDE 0.9 % IV SOLN: INTRAVENOUS | Status: DC

## 2013-06-18 NOTE — Discharge Instructions (Signed)
Antibiotic Nonuse ° Your caregiver felt that the infection or problem was not one that would be helped with an antibiotic. °Infections may be caused by viruses or bacteria. Only a caregiver can tell which one of these is the likely cause of an illness. A cold is the most common cause of infection in both adults and children. A cold is a virus. Antibiotic treatment will have no effect on a viral infection. Viruses can lead to many lost days of work caring for sick children and many missed days of school. Children may catch as many as 10 "colds" or "flus" per year during which they can be tearful, cranky, and uncomfortable. The goal of treating a virus is aimed at keeping the ill person comfortable. °Antibiotics are medications used to help the body fight bacterial infections. There are relatively few types of bacteria that cause infections but there are hundreds of viruses. While both viruses and bacteria cause infection they are very different types of germs. A viral infection will typically go away by itself within 7 to 10 days. Bacterial infections may spread or get worse without antibiotic treatment. °Examples of bacterial infections are: °· Sore throats (like strep throat or tonsillitis). °· Infection in the lung (pneumonia). °· Ear and skin infections. °Examples of viral infections are: °· Colds or flus. °· Most coughs and bronchitis. °· Sore throats not caused by Strep. °· Runny noses. °It is often best not to take an antibiotic when a viral infection is the cause of the problem. Antibiotics can kill off the helpful bacteria that we have inside our body and allow harmful bacteria to start growing. Antibiotics can cause side effects such as allergies, nausea, and diarrhea without helping to improve the symptoms of the viral infection. Additionally, repeated uses of antibiotics can cause bacteria inside of our body to become resistant. That resistance can be passed onto harmful bacterial. The next time you have  an infection it may be harder to treat if antibiotics are used when they are not needed. Not treating with antibiotics allows our own immune system to develop and take care of infections more efficiently. Also, antibiotics will work better for us when they are prescribed for bacterial infections. °Treatments for a child that is ill may include: °· Give extra fluids throughout the day to stay hydrated. °· Get plenty of rest. °· Only give your child over-the-counter or prescription medicines for pain, discomfort, or fever as directed by your caregiver. °· The use of a cool mist humidifier may help stuffy noses. °· Cold medications if suggested by your caregiver. °Your caregiver may decide to start you on an antibiotic if: °· The problem you were seen for today continues for a longer length of time than expected. °· You develop a secondary bacterial infection. °SEEK MEDICAL CARE IF: °· Fever lasts longer than 5 days. °· Symptoms continue to get worse after 5 to 7 days or become severe. °· Difficulty in breathing develops. °· Signs of dehydration develop (poor drinking, rare urinating, dark colored urine). °· Changes in behavior or worsening tiredness (listlessness or lethargy). °Document Released: 07/21/2001 Document Revised: 08/04/2011 Document Reviewed: 01/17/2009 °[employer]® Patient Information ©2014 [employer]. °You appear to have an upper respiratory infection (URI). An upper respiratory tract infection, or cold, is a viral infection of the air passages leading to the lungs. It is contagious and can be spread to others, especially during the first 3 or 4 days. It cannot be cured by antibiotics or other medicines. °RETURN IMMEDIATELY   IF you develop shortness of breath, confusion or altered mental status, a new rash, become dizzy, faint, or poorly responsive, or are unable to be cared for at home. °

## 2013-06-18 NOTE — ED Notes (Signed)
Pt c/o cough, generalized body aches, fever. Pt denies n/v.

## 2013-06-18 NOTE — ED Provider Notes (Signed)
CSN: [id number]     Arrival date & time 06/18/13  1039 History  This chart was scribed for Hurman Horn, MD by Shari Heritage, ED Scribe. The patient was seen in room APA05/APA05. Patient's care was started at 11:17 AM.    Chief Complaint  Patient presents with  . Influenza    The history is provided by the patient. No language interpreter was used.    HPI Comments: Travis Schmidt is a 68 y.o. male who presents to the Emergency Department complaining of constant, productive cough that began 2 days ago. Cough is productive of green-gray sputum and is worse with exertion. There is associated fever (Tmax 103 last night), body aches and mild dyspnea on exertion. He denies leg pain or swelling, visual changes, abdominal pain, hearing loss or difficulty swallowing. There is no nausea or vomiting. He has a medical history of high cholesterol, hypertension (treated with metoprolol), gout, palpitations.  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 He smokes 1 cigar daily.    Past Medical History  Diagnosis Date  . Depression   . High cholesterol   . Hypertension   . Gout   . Palpitations   . [place] syndrome 2004   Past Surgical History  Procedure Laterality Date  . Vasectomy    . Vasectomy reversal     Family History  Problem Relation Age of Onset  . Stroke Mother   . Heart attack Mother   . COPD Father   . Stroke Other    History  Substance Use Topics  . Smoking status: Current Some Day Smoker    Types: Cigars  . Smokeless tobacco: Never Used  . Alcohol Use: No    Review of Systems 10 Systems reviewed and all are negative for acute change except as noted in the HPI.   Allergies  Codeine and Penicillins  Home Medications   Current Outpatient Rx  Name  Route  Sig  Dispense  Refill  . ALPRAZolam (XANAX) 1 MG tablet   Oral   Take 1 mg by mouth at bedtime.         Marland Kitchen aspirin 325 MG tablet   Oral   Take 650 mg by mouth every 6 (six) hours  as needed for fever.         Marland Kitchen atorvastatin (LIPITOR) 80 MG tablet   Oral   Take 1 tablet (80 mg total) by mouth daily. NEEDS OFFICE VISIT   30 tablet   0   . diclofenac (VOLTAREN) 75 MG EC tablet   Oral   Take 1 tablet (75 mg total) by mouth 2 (two) times daily.   60 tablet   1   . escitalopram (LEXAPRO) 20 MG tablet   Oral   Take 20 mg by mouth daily.         . metoprolol succinate (TOPROL-XL) 100 MG 24 hr tablet      TAKE 1 TABLET DAILY.   30 tablet   0     Please schedule office visit   . QUEtiapine (SEROQUEL) 300 MG tablet   Oral   Take 300 mg by mouth at bedtime.         Marland Kitchen albuterol (PROVENTIL HFA;VENTOLIN HFA) 108 (90 BASE) MCG/ACT inhaler   Inhalation   Inhale 2 puffs into the lungs every 2 (two) hours as needed for wheezing or shortness of breath (cough).   1 Inhaler   0   .  predniSONE (DELTASONE) 20 MG tablet      2 tabs po daily x 4 days   8 tablet   0    Triage Vitals: BP 89/45  Pulse 81  Temp(Src) 98.4 F (36.9 C) (Oral)  Resp 20  Ht 6' (1.829 m)  Wt 210 lb (95.255 kg)  BMI 28.47 kg/m2  SpO2 96% Physical Exam  Nursing note and vitals reviewed. Constitutional:  Awake, alert, nontoxic appearance.  HENT:  Head: Atraumatic.  Eyes: Right eye exhibits no discharge. Left eye exhibits no discharge.  Neck: Neck supple.  Cardiovascular: Normal rate, regular rhythm and normal heart sounds.   No murmur heard. Pulmonary/Chest: Effort normal. No respiratory distress. He has wheezes (mild, scattered, expiratory). He has no rales. He exhibits no tenderness.  No accessory muscle usage. No retractions. No crackles. Pulse oximetry normal on room air. No rhonchi.   Abdominal: Soft. There is no tenderness. There is no rebound.  Musculoskeletal: He exhibits no tenderness.  Baseline ROM, no obvious new focal weakness.  Neurological:  Mental status and motor strength appears baseline for patient and situation.  Skin: No rash noted.  Psychiatric: He has  a normal mood and affect.    ED Course  Procedures (including critical care time) DIAGNOSTIC STUDIES: Oxygen Saturation is 96% on room air, adequate by my interpretation.    COORDINATION OF CARE: 11:20 AM- Patient understand and agree with initial ED impression and plan with expectations set for ED visit.  Patient sitting up asymptomatic was able to stand for xray also without lightheadedness, but still has slight hypotension with systolic blood pressure in the high 80s after 1 L fluid bolus, clinically doubt sepsis but will continue IVF. 1325  SBP>100 and Pt wants discharge after 2L IVF.  Results for orders placed during the hospital encounter of 06/18/13  BASIC METABOLIC PANEL      Result Value Range   Sodium 138  137 - 147 mEq/L   Potassium 4.2  3.7 - 5.3 mEq/L   Chloride 103  96 - 112 mEq/L   CO2 23  19 - 32 mEq/L   Glucose, Bld 134 (*) 70 - 99 mg/dL   BUN 25 (*) 6 - 23 mg/dL   Creatinine, Ser 8.111.39 (*) 0.50 - 1.35 mg/dL   Calcium 9.2  8.4 - 91.410.5 mg/dL   GFR calc non Af Amer 50 (*) >90 mL/min   GFR calc Af Amer 58 (*) >90 mL/min  CBC WITH DIFFERENTIAL      Result Value Range   WBC 5.1  4.0 - 10.5 K/uL   RBC 4.36  4.22 - 5.81 MIL/uL   Hemoglobin 14.2  13.0 - 17.0 g/dL   HCT 78.241.6  95.639.0 - 21.352.0 %   MCV 95.4  78.0 - 100.0 fL   MCH 32.6  26.0 - 34.0 pg   MCHC 34.1  30.0 - 36.0 g/dL   RDW 08.614.3  57.811.5 - 46.915.5 %   Platelets 131 (*) 150 - 400 K/uL   Neutrophils Relative % 75  43 - 77 %   Neutro Abs 3.8  1.7 - 7.7 K/uL   Lymphocytes Relative 13  12 - 46 %   Lymphs Abs 0.6 (*) 0.7 - 4.0 K/uL   Monocytes Relative 10  3 - 12 %   Monocytes Absolute 0.5  0.1 - 1.0 K/uL   Eosinophils Relative 1  0 - 5 %   Eosinophils Absolute 0.1  0.0 - 0.7 K/uL   Basophils Relative 1  0 -  1 %   Basophils Absolute 0.0  0.0 - 0.1 K/uL  CG4 I-STAT (LACTIC ACID)      Result Value Range   Lactic Acid, Venous 0.82  0.5 - 2.2 mmol/L     Imaging Review Dg Chest 2 View  06/18/2013   CLINICAL DATA:   Cough and congestion  EXAM: CHEST  2 VIEW  COMPARISON:  07/10/2003  FINDINGS: Normal heart size. Bronchitic changes. Hyperaeration. No pneumothorax. No pleural effusion.  IMPRESSION: No active cardiopulmonary disease.   Electronically Signed   By: Maryclare Bean M.D.   On: 06/18/2013 12:22    EKG Interpretation   None       MDM   1. Influenza   2. Bronchospasm    I doubt any other EMC precluding discharge at this time including, but not necessarily limited to the following:sepsis.  I personally performed the services described in this documentation, which was scribed in my presence. The recorded information has been reviewed and is accurate.    Hurman Horn, MD 06/18/13 [id number]

## 2013-06-20 ENCOUNTER — Ambulatory Visit (INDEPENDENT_AMBULATORY_CARE_PROVIDER_SITE_OTHER): Payer: Medicare PPO | Admitting: Family Medicine

## 2013-06-20 ENCOUNTER — Encounter: Payer: Self-pay | Admitting: Family Medicine

## 2013-06-20 VITALS — BP 112/64 | Temp 98.5°F | Ht 72.0 in | Wt 231.0 lb

## 2013-06-20 DIAGNOSIS — J45909 Unspecified asthma, uncomplicated: Secondary | ICD-10-CM

## 2013-06-20 DIAGNOSIS — J111 Influenza due to unidentified influenza virus with other respiratory manifestations: Secondary | ICD-10-CM

## 2013-06-20 DIAGNOSIS — J209 Acute bronchitis, unspecified: Secondary | ICD-10-CM

## 2013-06-20 MED ORDER — ALBUTEROL SULFATE HFA 108 (90 BASE) MCG/ACT IN AERS
2.0000 | INHALATION_SPRAY | RESPIRATORY_TRACT | Status: DC | PRN
Start: 2013-06-20 — End: 2014-01-20

## 2013-06-20 MED ORDER — ALBUTEROL SULFATE HFA 108 (90 BASE) MCG/ACT IN AERS: 2.0000 | INHALATION_SPRAY | RESPIRATORY_TRACT | Status: DC | PRN

## 2013-06-20 MED ORDER — DOXYCYCLINE HYCLATE 100 MG PO CAPS: 100.0000 mg | ORAL_CAPSULE | Freq: Two times a day (BID) | ORAL | Status: DC

## 2013-06-20 NOTE — Progress Notes (Signed)
   Subjective:    Patient ID: Travis Schmidt, male    DOB: [birth year], 68 y.o.   MRN: [id number]  Cough This is a new problem. The current episode started in the past 7 days. Associated symptoms include wheezing. Associated symptoms comments: congestion.   patient had flulike illness with compounding congestion and coughing over the past few days went to the ER and Saturday these notes and chest x-ray were reviewed. Went to ED on 01/24. Diagnosed with flu. Prescribed albuteral and prednisione.  Patient relates a little bit of shortness of breath with walking but he states when he sitting he is not short of breath. He does state he wheezes quite a bit at nighttime.  Review of Systems  Respiratory: Positive for cough and wheezing.    no vomiting or diarrhea.     Objective:   Physical Exam He does have bilateral expiratory wheezes not rest for distress he does have some chest congestion but no true crackles heart is regular extremities no edema skin warm dry       Assessment & Plan:  #1 the flu #2 post-influenza pneumonia antibiotics prescribed warning signs discussed if worse over the next 24-48 hours to followup

## 2013-06-20 NOTE — Addendum Note (Signed)
Addended by: Lilyan PuntLUKING, Yuan Gann A on: 06/20/2013 02:17 PM   Modules accepted: Orders

## 2013-07-02 ENCOUNTER — Other Ambulatory Visit: Payer: Self-pay | Admitting: Family Medicine

## 2013-08-01 ENCOUNTER — Other Ambulatory Visit: Payer: Self-pay | Admitting: Family Medicine

## 2013-08-11 ENCOUNTER — Other Ambulatory Visit: Payer: Self-pay | Admitting: Family Medicine

## 2013-12-07 ENCOUNTER — Other Ambulatory Visit: Payer: Self-pay | Admitting: Family Medicine

## 2013-12-07 NOTE — Telephone Encounter (Signed)
Last seen 06/20/13

## 2014-01-03 ENCOUNTER — Other Ambulatory Visit: Payer: Self-pay | Admitting: Family Medicine

## 2014-01-20 ENCOUNTER — Ambulatory Visit (INDEPENDENT_AMBULATORY_CARE_PROVIDER_SITE_OTHER): Payer: Medicare PPO | Admitting: Family Medicine

## 2014-01-20 ENCOUNTER — Encounter: Payer: Self-pay | Admitting: Family Medicine

## 2014-01-20 VITALS — BP 120/78 | Temp 98.4°F | Ht 72.0 in | Wt 242.0 lb

## 2014-01-20 DIAGNOSIS — Z23 Encounter for immunization: Secondary | ICD-10-CM

## 2014-01-20 DIAGNOSIS — J069 Acute upper respiratory infection, unspecified: Secondary | ICD-10-CM

## 2014-01-20 MED ORDER — DOXYCYCLINE HYCLATE 100 MG PO CAPS: 100.0000 mg | ORAL_CAPSULE | Freq: Two times a day (BID) | ORAL | Status: DC

## 2014-01-20 NOTE — Progress Notes (Signed)
   Subjective:    Patient ID: Travis Schmidt, male    DOB: February 13, 1946, 68 y.o.   MRN: [id number]  Cough This is a new problem. Associated symptoms include rhinorrhea. Pertinent negatives include no chest pain, ear pain, fever, nasal congestion or wheezing. He has tried prescription cough suppressant for the symptoms.  Patient states that is concerned about pneumonia since his friend newly diagnosed CHF has been put into the hospital.    Review of Systems  Constitutional: Negative for fever and activity change.  HENT: Positive for congestion and rhinorrhea. Negative for ear pain.   Eyes: Negative for discharge.  Respiratory: Positive for cough. Negative for wheezing.   Cardiovascular: Negative for chest pain.       Objective:   Physical Exam  Nursing note and vitals reviewed. Constitutional: He appears well-developed.  HENT:  Head: Normocephalic.  Mouth/Throat: Oropharynx is clear and moist. No oropharyngeal exudate.  Neck: Normal range of motion.  Cardiovascular: Normal rate, regular rhythm and normal heart sounds.   No murmur heard. Pulmonary/Chest: Effort normal and breath sounds normal. He has no wheezes.  Lymphadenopathy:    He has no cervical adenopathy.  Neurological: He exhibits normal muscle tone.  Skin: Skin is warm and dry.          Assessment & Plan:  Viral upper respiratory illness with secondary bronchitis  but I truly feel that this is viral. He may get the antibiotics filled in a day or 2 if he is getting worse

## 2014-02-01 ENCOUNTER — Other Ambulatory Visit: Payer: Self-pay | Admitting: Family Medicine

## 2014-02-09 ENCOUNTER — Other Ambulatory Visit: Payer: Self-pay | Admitting: Family Medicine

## 2014-03-08 ENCOUNTER — Other Ambulatory Visit: Payer: Self-pay | Admitting: Family Medicine

## 2014-04-08 ENCOUNTER — Other Ambulatory Visit: Payer: Self-pay | Admitting: Family Medicine

## 2014-04-14 ENCOUNTER — Encounter: Payer: Self-pay | Admitting: Family Medicine

## 2014-04-14 ENCOUNTER — Ambulatory Visit (INDEPENDENT_AMBULATORY_CARE_PROVIDER_SITE_OTHER): Payer: Medicare PPO | Admitting: Family Medicine

## 2014-04-14 VITALS — BP 128/80 | Temp 97.6°F | Ht 72.0 in | Wt 234.0 lb

## 2014-04-14 DIAGNOSIS — J208 Acute bronchitis due to other specified organisms: Secondary | ICD-10-CM

## 2014-04-14 MED ORDER — PREDNISONE 20 MG PO TABS: ORAL_TABLET | ORAL | Status: DC

## 2014-04-14 MED ORDER — CEFPROZIL 500 MG PO TABS: 500.0000 mg | ORAL_TABLET | Freq: Two times a day (BID) | ORAL | Status: DC

## 2014-04-14 NOTE — Progress Notes (Signed)
   Subjective:    Patient ID: Travis Schmidt, male    DOB: September 01, 1945, 68 y.o.   MRN: [id number]  Cough This is a new problem. The current episode started yesterday. The problem has been gradually worsening. The cough is productive of sputum. Associated symptoms include a sore throat and wheezing. Associated symptoms comments: fatigue. Nothing aggravates the symptoms. He has tried nothing for the symptoms. His past medical history is significant for bronchitis and pneumonia.    PMH benign  Review of Systems  HENT: Positive for sore throat.   Respiratory: Positive for cough and wheezing.        Objective:   Physical Exam  Eardrums normal throat is normal neck no masses Lungs are clear no crackles noted central chest congestion is noted. Extremities no edema      Assessment & Plan:  Viral syndrome secondary bronchitis has a history of pneumonia patient abstinent from alcohol currently we will go ahead with antibiotics. Warning signs discussed. If progressive nature follow-up.  He is to use albuterol currently for wheezing but if he has progressive troubles he may need to get his prednisone prescription filled this was discussed in detail

## 2014-04-28 ENCOUNTER — Other Ambulatory Visit: Payer: Self-pay | Admitting: Family Medicine

## 2014-05-11 ENCOUNTER — Other Ambulatory Visit: Payer: Self-pay | Admitting: Family Medicine

## 2014-05-11 NOTE — Telephone Encounter (Signed)
Needs office visit.

## 2014-05-22 ENCOUNTER — Other Ambulatory Visit: Payer: Self-pay | Admitting: Family Medicine

## 2014-06-10 ENCOUNTER — Other Ambulatory Visit: Payer: Self-pay | Admitting: Family Medicine

## 2014-06-23 ENCOUNTER — Other Ambulatory Visit: Payer: Self-pay | Admitting: Family Medicine

## 2014-07-21 ENCOUNTER — Other Ambulatory Visit: Payer: Self-pay | Admitting: Family Medicine

## 2014-07-29 ENCOUNTER — Other Ambulatory Visit: Payer: Self-pay | Admitting: Family Medicine

## 2014-08-14 ENCOUNTER — Telehealth: Payer: Self-pay | Admitting: Family Medicine

## 2014-08-14 ENCOUNTER — Other Ambulatory Visit: Payer: Self-pay | Admitting: Family Medicine

## 2014-08-14 MED ORDER — ATORVASTATIN CALCIUM 80 MG PO TABS: 80.0000 mg | ORAL_TABLET | Freq: Every day | ORAL | Status: DC

## 2014-08-14 NOTE — Telephone Encounter (Signed)
Pt is needing a refill on his lipitor. Pt has an appt scheduled for 09/05/14.  [facility]

## 2014-08-14 NOTE — Telephone Encounter (Signed)
Med sent to pharmacy. Patient was notified.  

## 2014-08-28 ENCOUNTER — Other Ambulatory Visit: Payer: Self-pay | Admitting: Family Medicine

## 2014-09-05 ENCOUNTER — Encounter: Payer: Self-pay | Admitting: Family Medicine

## 2014-09-05 ENCOUNTER — Ambulatory Visit (INDEPENDENT_AMBULATORY_CARE_PROVIDER_SITE_OTHER): Payer: Medicare PPO | Admitting: Family Medicine

## 2014-09-05 VITALS — BP 128/82 | Ht 72.0 in | Wt 227.4 lb

## 2014-09-05 DIAGNOSIS — I10 Essential (primary) hypertension: Secondary | ICD-10-CM | POA: Diagnosis not present

## 2014-09-05 DIAGNOSIS — E782 Mixed hyperlipidemia: Secondary | ICD-10-CM | POA: Diagnosis not present

## 2014-09-05 MED ORDER — ATORVASTATIN CALCIUM 80 MG PO TABS
80.0000 mg | ORAL_TABLET | Freq: Every day | ORAL | Status: DC
Start: 2014-09-05 — End: 2015-03-19

## 2014-09-05 MED ORDER — METOPROLOL SUCCINATE ER 50 MG PO TB24
50.0000 mg | ORAL_TABLET | Freq: Every day | ORAL | Status: DC
Start: 2014-09-05 — End: 2015-03-12

## 2014-09-05 MED ORDER — DICLOFENAC SODIUM 75 MG PO TBEC: DELAYED_RELEASE_TABLET | ORAL | Status: DC

## 2014-09-05 NOTE — Progress Notes (Signed)
   Subjective:    Patient ID: Travis Schmidt, male    DOB: August 23, 1945, 69 y.o.   MRN: [id number]  Hypertension This is a chronic problem. The current episode started more than 1 year ago. The problem is unchanged. The problem is controlled. Pertinent negatives include no chest pain, orthopnea or palpitations. Past treatments include beta blockers. The current treatment provides significant improvement. There are no compliance problems.  There is no history of angina or CAD/MI.  Hyperlipidemia This is a chronic problem. The current episode started more than 1 year ago. The problem is controlled. Recent lipid tests were reviewed and are normal. There are no known factors aggravating his hyperlipidemia. Pertinent negatives include no chest pain. Current antihyperlipidemic treatment includes statins. The current treatment provides significant improvement of lipids. There are no compliance problems.     Patient arrives for a follow up on his meds and to get orders to do blood work.  Review of Systems  Constitutional: Negative for activity change, appetite change and fatigue.  HENT: Negative for congestion.   Respiratory: Negative for cough.   Cardiovascular: Negative for chest pain, palpitations and orthopnea.  Gastrointestinal: Negative for abdominal pain.  Endocrine: Negative for polydipsia and polyphagia.  Neurological: Negative for weakness.  Psychiatric/Behavioral: Negative for confusion.       Objective:   Physical Exam  Constitutional: He appears well-nourished. No distress.  Cardiovascular: Normal rate, regular rhythm and normal heart sounds.   No murmur heard. Pulmonary/Chest: Effort normal and breath sounds normal. No respiratory distress.  Musculoskeletal: He exhibits no edema.  Lymphadenopathy:    He has no cervical adenopathy.  Neurological: He is alert.  Psychiatric: His behavior is normal.  Vitals reviewed.         Assessment & Plan:  HTN-overall good control  his blood pressures actually 100/70 on recommend reducing metoprolol XL I would recommend now using 50 mg daily  Hyperlipidemia continue current medication check lab work also the liver metabolic 7  Patient in the past has not wanted colonoscopy or PSA  Patient does see psychiatrist for his other medications  Follow-up in 6-9 months

## 2014-09-06 ENCOUNTER — Encounter: Payer: Self-pay | Admitting: Family Medicine

## 2014-09-06 LAB — BASIC METABOLIC PANEL
BUN / CREAT RATIO: 12 (ref 10–22)
BUN: 14 mg/dL (ref 8–27)
CO2: 26 mmol/L (ref 18–29)
CREATININE: 1.21 mg/dL (ref 0.76–1.27)
Calcium: 9.2 mg/dL (ref 8.6–10.2)
Chloride: 105 mmol/L (ref 97–108)
GFR calc Af Amer: 70 mL/min/{1.73_m2} (ref >59)
GFR calc non Af Amer: 60 mL/min/{1.73_m2} (ref >59)
GLUCOSE: 116 mg/dL — AB (ref 65–99)
Potassium: 4.8 mmol/L (ref 3.5–5.2)
Sodium: 145 mmol/L — ABNORMAL HIGH (ref 134–144)

## 2014-09-06 LAB — HEPATIC FUNCTION PANEL
ALBUMIN: 4.4 g/dL (ref 3.5–4.8)
ALT: 21 IU/L (ref 0–44)
AST: 19 IU/L (ref 0–40)
Alkaline Phosphatase: 88 IU/L (ref 39–117)
Bilirubin Total: 0.4 mg/dL (ref 0.0–1.2)
Bilirubin, Direct: 0.11 mg/dL (ref 0.00–0.40)
TOTAL PROTEIN: 6.7 g/dL (ref 6.0–8.5)

## 2014-09-06 LAB — LIPID PANEL
Chol/HDL Ratio: 4.6 ratio units (ref 0.0–5.0)
Cholesterol, Total: 197 mg/dL (ref 100–199)
HDL: 43 mg/dL (ref >39)
LDL Calculated: 115 mg/dL — ABNORMAL HIGH (ref 0–99)
Triglycerides: 193 mg/dL — ABNORMAL HIGH (ref 0–149)
VLDL Cholesterol Cal: 39 mg/dL (ref 5–40)

## 2015-01-04 DIAGNOSIS — L570 Actinic keratosis: Secondary | ICD-10-CM | POA: Diagnosis not present

## 2015-01-04 DIAGNOSIS — X32XXXD Exposure to sunlight, subsequent encounter: Secondary | ICD-10-CM | POA: Diagnosis not present

## 2015-01-04 DIAGNOSIS — L905 Scar conditions and fibrosis of skin: Secondary | ICD-10-CM | POA: Diagnosis not present

## 2015-01-04 DIAGNOSIS — D225 Melanocytic nevi of trunk: Secondary | ICD-10-CM | POA: Diagnosis not present

## 2015-01-16 IMAGING — CR DG CHEST 2V
3 series · 3 of 3 positions shown · non-contrast
Comparison: 07/10/2003

CLINICAL DATA: Cough and congestion

EXAM:
CHEST  2 VIEW

[view not recorded (1 of 3)]
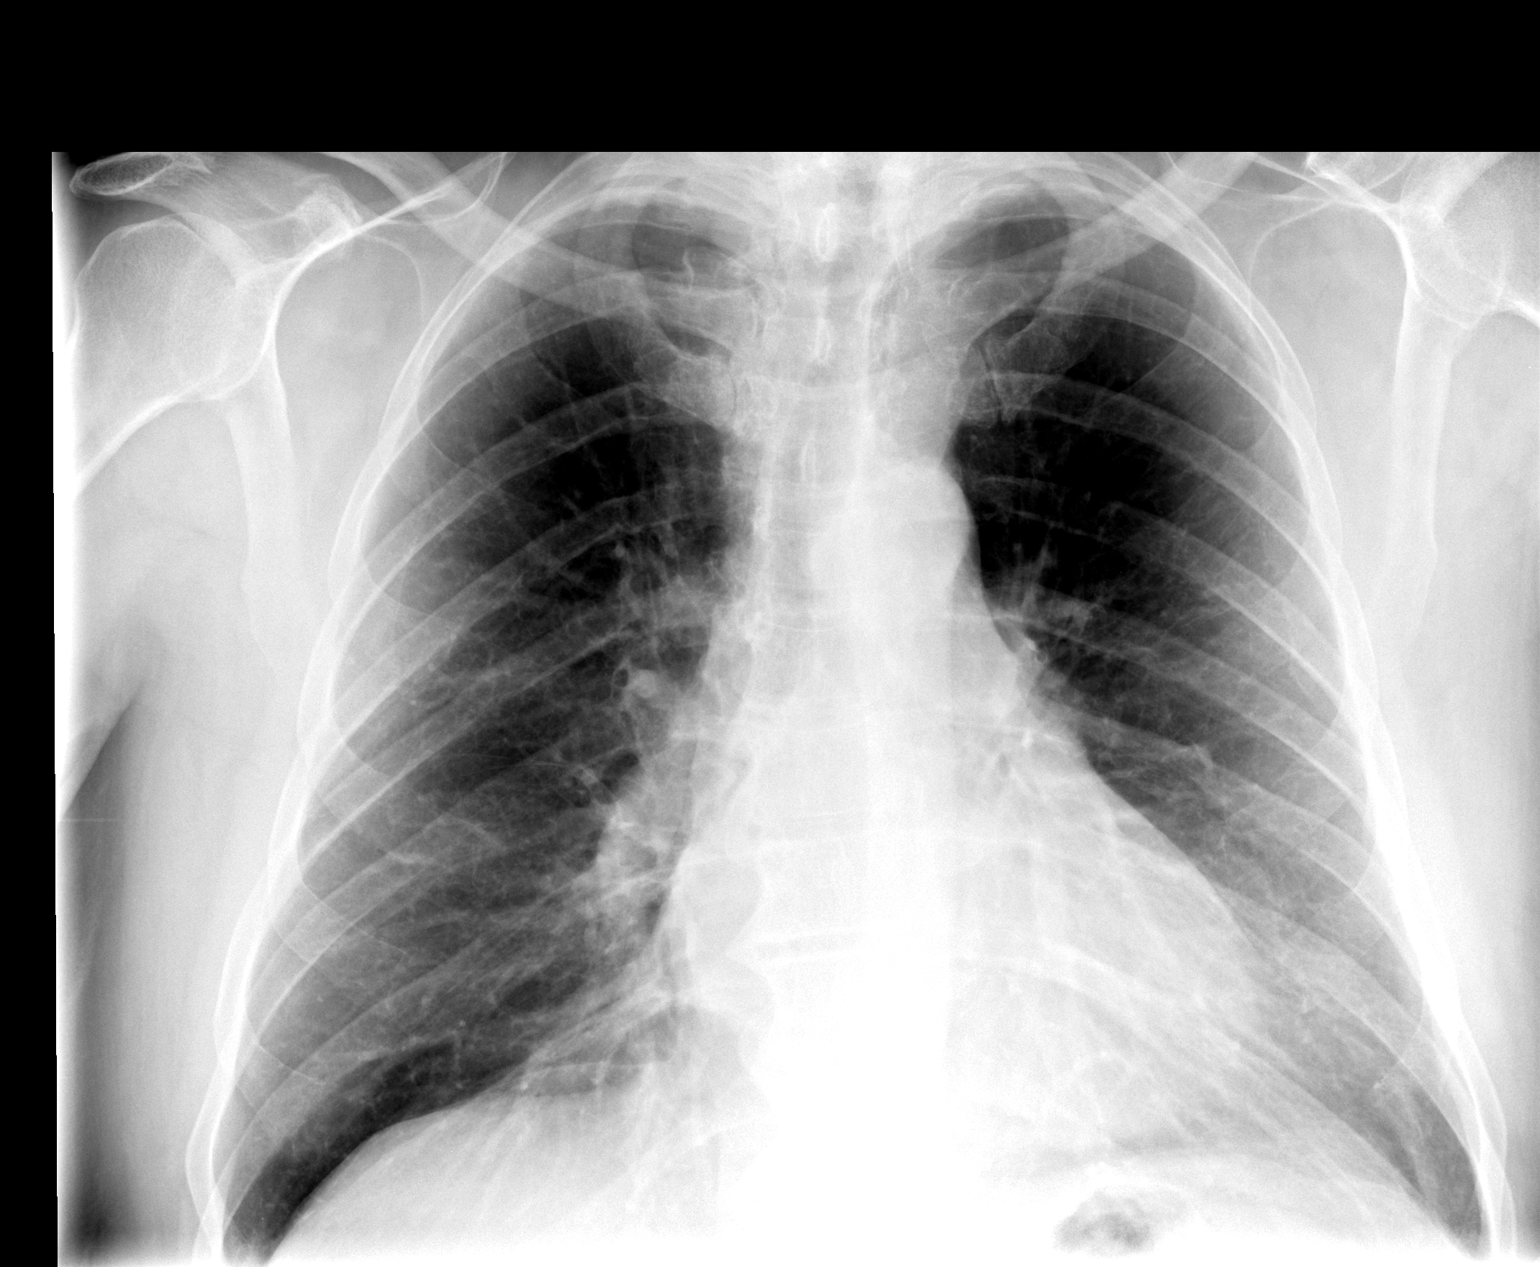

[view not recorded (2 of 3)]
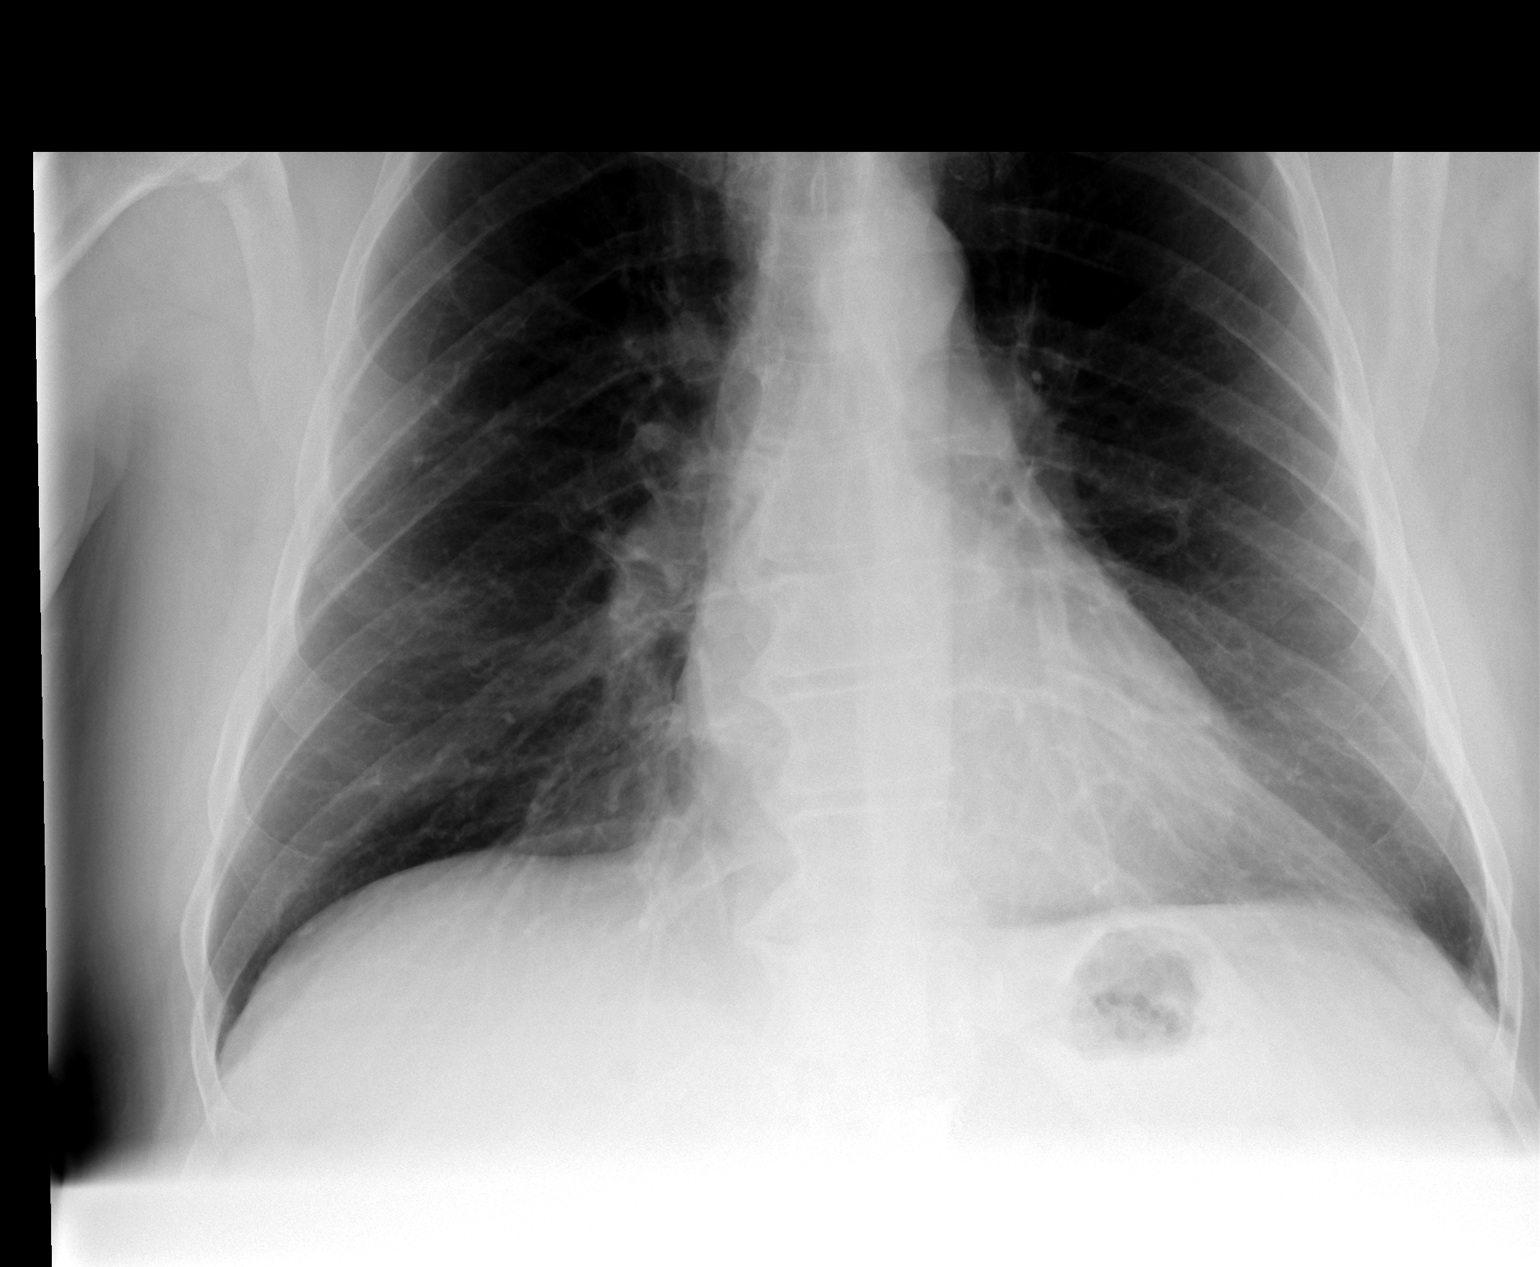

[view not recorded (3 of 3)]
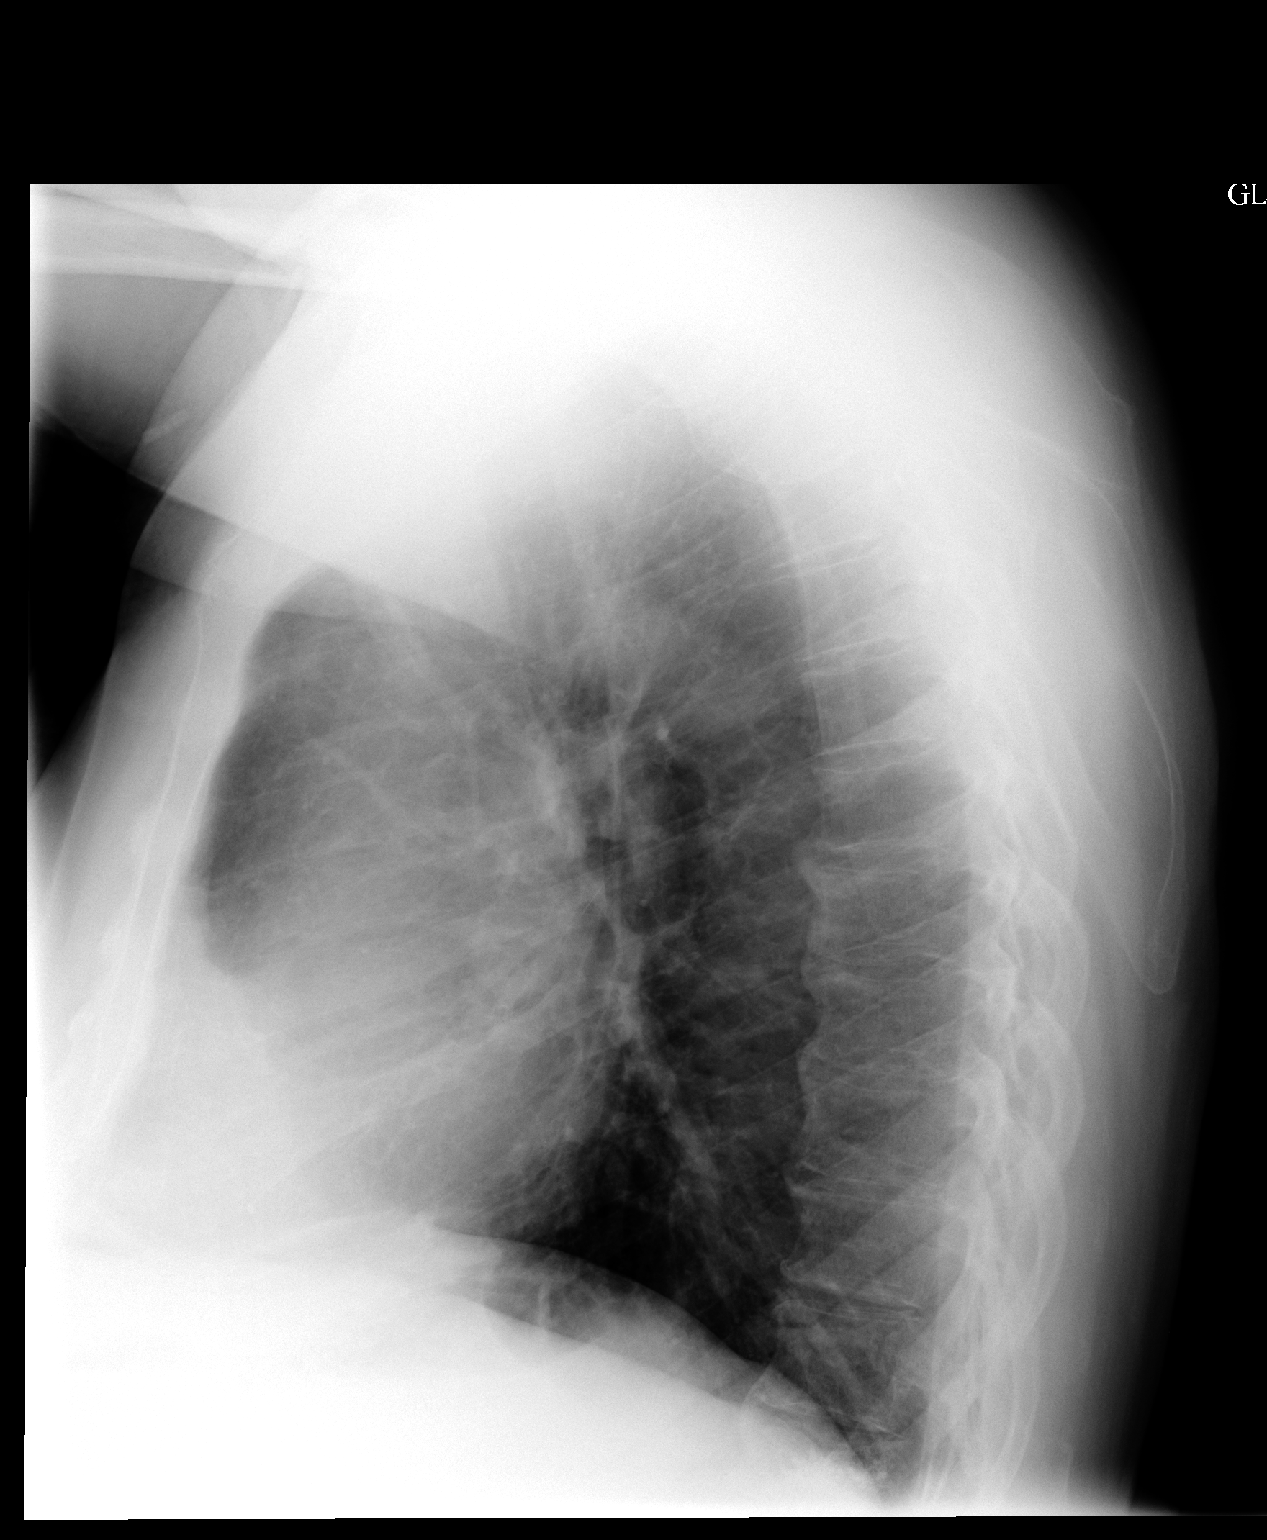

[3 of 3 positions shown; findings below may reference images not displayed]

FINDINGS: Normal heart size. Bronchitic changes. Hyperaeration. No
pneumothorax. No pleural effusion.
IMPRESSION: No active cardiopulmonary disease.

## 2015-03-12 ENCOUNTER — Other Ambulatory Visit: Payer: Self-pay | Admitting: Family Medicine

## 2015-03-19 ENCOUNTER — Other Ambulatory Visit: Payer: Self-pay | Admitting: Family Medicine

## 2015-05-22 ENCOUNTER — Ambulatory Visit (INDEPENDENT_AMBULATORY_CARE_PROVIDER_SITE_OTHER): Payer: Medicare PPO | Admitting: Nurse Practitioner

## 2015-05-22 ENCOUNTER — Encounter: Payer: Self-pay | Admitting: Nurse Practitioner

## 2015-05-22 VITALS — BP 138/76 | Temp 98.8°F | Ht 72.0 in | Wt 225.2 lb

## 2015-05-22 DIAGNOSIS — J069 Acute upper respiratory infection, unspecified: Secondary | ICD-10-CM

## 2015-05-22 DIAGNOSIS — R062 Wheezing: Secondary | ICD-10-CM

## 2015-05-22 DIAGNOSIS — J209 Acute bronchitis, unspecified: Secondary | ICD-10-CM | POA: Diagnosis not present

## 2015-05-22 DIAGNOSIS — B9689 Other specified bacterial agents as the cause of diseases classified elsewhere: Secondary | ICD-10-CM

## 2015-05-22 MED ORDER — ALBUTEROL SULFATE (2.5 MG/3ML) 0.083% IN NEBU
2.5000 mg | INHALATION_SOLUTION | Freq: Once | RESPIRATORY_TRACT | Status: AC
  Administered 2015-05-22: 2.5 mg via RESPIRATORY_TRACT

## 2015-05-22 MED ORDER — CEFDINIR 300 MG PO CAPS: 300.0000 mg | ORAL_CAPSULE | Freq: Two times a day (BID) | ORAL | Status: DC

## 2015-05-22 MED ORDER — PREDNISONE 20 MG PO TABS: ORAL_TABLET | ORAL | Status: DC

## 2015-05-22 MED ORDER — ALBUTEROL SULFATE HFA 108 (90 BASE) MCG/ACT IN AERS: 2.0000 | INHALATION_SPRAY | RESPIRATORY_TRACT | Status: DC | PRN

## 2015-05-23 ENCOUNTER — Encounter: Payer: Self-pay | Admitting: Nurse Practitioner

## 2015-05-23 NOTE — Progress Notes (Signed)
Subjective:  Presents for c/o cough and wheezing that began 4 days ago. No fever. Frequent cough producing green sputum. No headache, sore throat or ear pain. Using albuterol inhaler twice a day for past 2 days. Smokes about one cigar per day. No other tobacco use. Taking fluids well.   Objective:   BP 138/76 mmHg  Temp(Src) 98.8 F (37.1 C) (Oral)  Ht 6' (1.829 m)  Wt 225 lb 4 oz (102.173 kg)  BMI 30.54 kg/m2 NAD. Alert, oriented. TMs clear effusion. Pharynx injected with PND noted. Neck supple with moderate anterior adenopathy. Lungs: initially faint inspiratory and expiratory wheezes noted throughout lung fields with mildly diminished breath sounds. No tachypnea but mild SOB with talking. Normal color. Given albuterol 2.5 mg neb treatment. Improved air flow; inspiratory wheezes resolved; faint expiratory wheezes noted. Subjective improvement of symptoms. Heart RRR.   Assessment: Bacterial upper respiratory infection - Plan: albuterol (PROVENTIL) (2.5 MG/3ML) 0.083% nebulizer solution 2.5 mg  Wheezing - Plan: albuterol (PROVENTIL) (2.5 MG/3ML) 0.083% nebulizer solution 2.5 mg  Acute bronchitis, unspecified organism  Plan:  Meds ordered this encounter  Medications  . albuterol (PROVENTIL) (2.5 MG/3ML) 0.083% nebulizer solution 2.5 mg    Sig:   . predniSONE (DELTASONE) 20 MG tablet    Sig: 3 po qd x 3 d then 2 po qd x 3 d then 1 po qd x 3 d    Dispense:  18 tablet    Refill:  0    Order Specific Question:  Supervising Provider    Answer:  Merlyn AlbertLUKING, WILLIAM S [[id number]]  . albuterol (PROVENTIL HFA;VENTOLIN HFA) 108 (90 Base) MCG/ACT inhaler    Sig: Inhale 2 puffs into the lungs every 4 (four) hours as needed.    Dispense:  1 Inhaler    Refill:  0    Order Specific Question:  Supervising Provider    Answer:  Merlyn AlbertLUKING, WILLIAM S [[id number]]  . cefdinir (OMNICEF) 300 MG capsule    Sig: Take 1 capsule (300 mg total) by mouth 2 (two) times daily.    Dispense:  20 capsule    Refill:  0    Has taken  Cephalosporins without difficulty    Order Specific Question:  Supervising Provider    Answer:  Merlyn AlbertLUKING, WILLIAM S [[id number]]   Mucinex DM as directed for cough. Warning signs reviewed. Call back in 48 hours if no improvement, sooner if worse.

## 2015-06-19 ENCOUNTER — Encounter: Payer: Self-pay | Admitting: Family Medicine

## 2015-06-19 ENCOUNTER — Ambulatory Visit (INDEPENDENT_AMBULATORY_CARE_PROVIDER_SITE_OTHER): Payer: Medicare Other | Admitting: Family Medicine

## 2015-06-19 VITALS — Temp 97.6°F | Ht 72.0 in | Wt 225.6 lb

## 2015-06-19 DIAGNOSIS — L0390 Cellulitis, unspecified: Secondary | ICD-10-CM | POA: Diagnosis not present

## 2015-06-19 DIAGNOSIS — L0291 Cutaneous abscess, unspecified: Secondary | ICD-10-CM | POA: Diagnosis not present

## 2015-06-19 MED ORDER — DOXYCYCLINE HYCLATE 100 MG PO TABS: 100.0000 mg | ORAL_TABLET | Freq: Two times a day (BID) | ORAL | Status: DC

## 2015-06-19 MED ORDER — OXYCODONE-ACETAMINOPHEN 5-325 MG PO TABS: 1.0000 | ORAL_TABLET | ORAL | Status: DC | PRN

## 2015-06-19 NOTE — Progress Notes (Signed)
   Subjective:    Patient ID: Travis Schmidt, male    DOB: 06/01/1945, 70 y.o.   MRN: [id number]  HPI  Patient has an infected area on his bottom. Patient states he has a hx of herpes and feels like a blister is infected on his bottom.  patient states the level of pain was bad enough to keep him awake at night he thought he had an area of herpes it got infected states he became red drained some pus Review of Systems  denies fever sweats chills.    Culture was taken. Objective:   Physical Exam   this patient had a abscess on the left but that is already drained. Had a large cellulitis associated with it. Procedure with the patient's consent lidocaine injected to numb the area pus was removed. Packing was placed. Dressing was applied.      Assessment & Plan:   cellulitis with abscess doxycycline twice a day 10 days warm compresses frequently recheck in 24 hours oxycodone as necessary for pain caution drowsiness

## 2015-06-20 ENCOUNTER — Ambulatory Visit (INDEPENDENT_AMBULATORY_CARE_PROVIDER_SITE_OTHER): Payer: Medicare Other | Admitting: Family Medicine

## 2015-06-20 ENCOUNTER — Encounter: Payer: Self-pay | Admitting: Family Medicine

## 2015-06-20 VITALS — BP 118/60 | Temp 98.1°F | Ht 72.0 in | Wt 225.0 lb

## 2015-06-20 DIAGNOSIS — L0390 Cellulitis, unspecified: Secondary | ICD-10-CM

## 2015-06-20 DIAGNOSIS — L0291 Cutaneous abscess, unspecified: Secondary | ICD-10-CM | POA: Diagnosis not present

## 2015-06-20 NOTE — Progress Notes (Signed)
   Subjective:    Patient ID: Travis Schmidt, male    DOB: 02-May-1946, 70 y.o.   MRN: [id number]  HPI Patient is here today for a recheck of abscess on bottom. Patient states that his symptoms are a lot better.  Patient states that he has no concerns at this time.    patient tolerating the antibiotic well tolerating and the pain medicine. States did have some drainage is doing warm compresses denies fever chills sweats nausea vomiting Review of Systems     Objective:   Physical Exam   cellulitis, abscess packing removed, overall should do well , significant cellulitis still present but no residual abscess this is more of a hole in the skin as well as a healing abscess      Assessment & Plan:   significant cellulitis of the by Dr. Not quite as angry looking as it was in addition to this has a whole in cavity in the by Dr. From recent infection this will gradually heal in on its own. No other particular troubles finish antibiotics do warm compresses

## 2015-06-22 LAB — WOUND CULTURE

## 2015-06-27 ENCOUNTER — Ambulatory Visit (INDEPENDENT_AMBULATORY_CARE_PROVIDER_SITE_OTHER): Payer: Medicare Other | Admitting: Family Medicine

## 2015-06-27 ENCOUNTER — Encounter: Payer: Self-pay | Admitting: Family Medicine

## 2015-06-27 VITALS — BP 122/70 | Temp 98.1°F | Ht 72.0 in | Wt 223.0 lb

## 2015-06-27 DIAGNOSIS — L0390 Cellulitis, unspecified: Secondary | ICD-10-CM

## 2015-06-27 DIAGNOSIS — L0291 Cutaneous abscess, unspecified: Secondary | ICD-10-CM

## 2015-06-27 DIAGNOSIS — J209 Acute bronchitis, unspecified: Secondary | ICD-10-CM | POA: Diagnosis not present

## 2015-06-27 MED ORDER — PREDNISONE 20 MG PO TABS
ORAL_TABLET | ORAL | Status: DC
Start: 2015-06-27 — End: 2015-09-05

## 2015-06-27 NOTE — Progress Notes (Signed)
   Subjective:    Patient ID: Travis Schmidt, male    DOB: 03/26/1946, 70 y.o.   MRN: [id number]  HPIFollow up cellulitis on bottom.  Tolerating antibiotic minimal drainage  Cough, congestion, wheezing. Started 6 days ago.   patient without fever denies nausea vomiting diarrhea.   Review of Systems  see above    Objective:   Physical Exam   minimal wheeze noted not rest or distress heart regular cellulitis on the left by Dr. Abscess much much better.      Assessment & Plan:   reactive airway prednisone taper albuterol when necessary no need for antibiotics if fevers or other problems follow-up immediately Cellulitis on the bottom finish out the antibiotics over the next several days If ongoing trouble follow-up

## 2015-07-09 ENCOUNTER — Other Ambulatory Visit: Payer: Self-pay | Admitting: Family Medicine

## 2015-09-05 ENCOUNTER — Encounter: Payer: Self-pay | Admitting: Family Medicine

## 2015-09-05 ENCOUNTER — Ambulatory Visit (INDEPENDENT_AMBULATORY_CARE_PROVIDER_SITE_OTHER): Payer: Medicare Other | Admitting: Family Medicine

## 2015-09-05 VITALS — BP 122/74 | Temp 98.9°F | Ht 72.0 in | Wt 221.0 lb

## 2015-09-05 DIAGNOSIS — L723 Sebaceous cyst: Secondary | ICD-10-CM | POA: Diagnosis not present

## 2015-09-05 DIAGNOSIS — L739 Follicular disorder, unspecified: Secondary | ICD-10-CM

## 2015-09-05 MED ORDER — DOXYCYCLINE HYCLATE 100 MG PO CAPS: 100.0000 mg | ORAL_CAPSULE | Freq: Two times a day (BID) | ORAL | Status: DC

## 2015-09-05 NOTE — Progress Notes (Signed)
   Subjective:    Patient ID: Travis Schmidt, male    DOB: Mar 29, 1946, 70 y.o.   MRN: [id number]  HPIswollen lymph node under left arm for one month.  He states his serious come and gone to some degree but now has been persistent any states at times it is tender he was concerned about the possibility of lymph node he denies fever chills sweats denies appetite change states energy level overall doing well PMH benign   Review of Systems     Objective:   Physical Exam Thorough exam was done underneath both arms area no lymph nodes were found no lymph nodes around the neck lungs clear hearts regular there appears to be a small infectious sebaceous cyst that is to the skin but is not draining it is red slightly       Assessment & Plan:  Left axilla area has significant infection along the surface. It appears to be a sebaceous cyst sets infected I recommend doxycycline twice a day over the course of next 10 days warm compresses a recheck this area in 4 weeks I do not find evidence of lymph node do not find evidence of cancer warning signs were discussed with patient

## 2015-09-06 ENCOUNTER — Other Ambulatory Visit: Payer: Self-pay | Admitting: Family Medicine

## 2015-09-14 ENCOUNTER — Other Ambulatory Visit: Payer: Self-pay | Admitting: *Deleted

## 2015-09-14 ENCOUNTER — Telehealth: Payer: Self-pay | Admitting: Family Medicine

## 2015-09-14 MED ORDER — METOPROLOL SUCCINATE ER 50 MG PO TB24: ORAL_TABLET | ORAL | Status: DC

## 2015-09-14 MED ORDER — DICLOFENAC SODIUM 75 MG PO TBEC: DELAYED_RELEASE_TABLET | ORAL | Status: DC

## 2015-09-14 NOTE — Telephone Encounter (Signed)
Sure 90 d with 1 rf

## 2015-09-14 NOTE — Telephone Encounter (Signed)
Last med check was 09/05/14.

## 2015-09-14 NOTE — Telephone Encounter (Signed)
Pt is requesting a refill on his metoprolol states that he was just seen and doesn't know why he needs an office visit. Pt was seen on 4/12 but for a swollen lymph node. Please advise.      [facility]

## 2015-09-14 NOTE — Telephone Encounter (Signed)
Patient may have refill on metoprolol +6 additional refills

## 2015-09-14 NOTE — Telephone Encounter (Signed)
Med sent to pharm.pt notified.  

## 2015-09-14 NOTE — Telephone Encounter (Signed)
Med sent to pharm. Pt notified.  

## 2015-09-14 NOTE — Telephone Encounter (Signed)
Pt also wants refill on diclofenac 75 mg one bid. Pt states he takes as needed and his refills expired before getting them. [facility]ing [facility]

## 2015-09-17 ENCOUNTER — Other Ambulatory Visit: Payer: Self-pay | Admitting: Family Medicine

## 2015-10-25 ENCOUNTER — Ambulatory Visit (INDEPENDENT_AMBULATORY_CARE_PROVIDER_SITE_OTHER): Payer: Medicare Other | Admitting: Family Medicine

## 2015-10-25 ENCOUNTER — Encounter: Payer: Self-pay | Admitting: Family Medicine

## 2015-10-25 VITALS — BP 128/82 | Ht 72.0 in | Wt 217.6 lb

## 2015-10-25 DIAGNOSIS — L03314 Cellulitis of groin: Secondary | ICD-10-CM | POA: Diagnosis not present

## 2015-10-25 DIAGNOSIS — I10 Essential (primary) hypertension: Secondary | ICD-10-CM | POA: Diagnosis not present

## 2015-10-25 DIAGNOSIS — E785 Hyperlipidemia, unspecified: Secondary | ICD-10-CM | POA: Diagnosis not present

## 2015-10-25 MED ORDER — DOXYCYCLINE HYCLATE 100 MG PO CAPS: 100.0000 mg | ORAL_CAPSULE | Freq: Two times a day (BID) | ORAL | Status: DC

## 2015-10-25 NOTE — Progress Notes (Signed)
   Subjective:    Patient ID: Travis JubileeJames A Daws Schmidt, male    DOB: 02/25/1946, 70 y.o.   MRN: [id number]  HPI Patient arrives with c/o lump on testicle for 3 days. He's noted a sore area on right side of the testicle region has been progressively worse over the past few days denies high fever chills sweats abdominal pain nausea vomiting diarrhea. Also needs lab work on his other medications.  Review of Systems     Objective:   Physical Exam On physical examination the testicles are normal. In the groin region on the right side there is a soft area yet tender that's approximately 1" x 3" x 1" no abscess is felt within this area no drainage. There is no hernia on examination on the left side there is a minimal area similar to this. Abdomen is soft lungs normal heart regular pulse normal       Assessment & Plan:  Hyperlipidemia continue medication check lab work await the results recent lab work from last year reviewed 81 mg aspirin recommended continuing Xanax 1 mg at nighttime continue Soft tissue infection antibiotics twice daily over the course of the next 2 weeks recommended recheck next week if ongoing troubles may need referral for ultrasound or scan I believe that this is more likely infection warm compresses frequently as directed.

## 2015-10-26 LAB — BASIC METABOLIC PANEL
BUN/Creatinine Ratio: 13 (ref 10–24)
BUN: 25 mg/dL (ref 8–27)
CALCIUM: 9.2 mg/dL (ref 8.6–10.2)
CO2: 23 mmol/L (ref 18–29)
Chloride: 101 mmol/L (ref 96–106)
Creatinine, Ser: 1.87 mg/dL — ABNORMAL HIGH (ref 0.76–1.27)
GFR calc Af Amer: 41 mL/min/{1.73_m2} — ABNORMAL LOW (ref >59)
GFR calc non Af Amer: 35 mL/min/{1.73_m2} — ABNORMAL LOW (ref >59)
GLUCOSE: 106 mg/dL — AB (ref 65–99)
POTASSIUM: 4.7 mmol/L (ref 3.5–5.2)
SODIUM: 140 mmol/L (ref 134–144)

## 2015-10-26 LAB — CBC WITH DIFFERENTIAL/PLATELET
Basophils Absolute: 0 10*3/uL (ref 0.0–0.2)
Basos: 0 %
EOS (ABSOLUTE): 0.2 10*3/uL (ref 0.0–0.4)
Eos: 2 %
HEMOGLOBIN: 14.2 g/dL (ref 12.6–17.7)
Hematocrit: 40.8 % (ref 37.5–51.0)
IMMATURE GRANULOCYTES: 0 %
Immature Grans (Abs): 0 10*3/uL (ref 0.0–0.1)
Lymphocytes Absolute: 1.8 10*3/uL (ref 0.7–3.1)
Lymphs: 16 %
MCH: 32.6 pg (ref 26.6–33.0)
MCHC: 34.8 g/dL (ref 31.5–35.7)
MCV: 94 fL (ref 79–97)
MONOS ABS: 0.9 10*3/uL (ref 0.1–0.9)
Monocytes: 9 %
NEUTROS PCT: 73 %
Neutrophils Absolute: 7.7 10*3/uL — ABNORMAL HIGH (ref 1.4–7.0)
Platelets: 180 10*3/uL (ref 150–379)
RBC: 4.35 x10E6/uL (ref 4.14–5.80)
RDW: 13.9 % (ref 12.3–15.4)
WBC: 10.7 10*3/uL (ref 3.4–10.8)

## 2015-10-26 LAB — HEPATIC FUNCTION PANEL
ALK PHOS: 78 IU/L (ref 39–117)
ALT: 11 IU/L (ref 0–44)
AST: 14 IU/L (ref 0–40)
Albumin: 4.2 g/dL (ref 3.5–4.8)
Bilirubin Total: 0.8 mg/dL (ref 0.0–1.2)
Bilirubin, Direct: 0.22 mg/dL (ref 0.00–0.40)
Total Protein: 7 g/dL (ref 6.0–8.5)

## 2015-10-26 LAB — LIPID PANEL
CHOLESTEROL TOTAL: 177 mg/dL (ref 100–199)
Chol/HDL Ratio: 3.6 ratio units (ref 0.0–5.0)
HDL: 49 mg/dL (ref >39)
LDL Calculated: 102 mg/dL — ABNORMAL HIGH (ref 0–99)
Triglycerides: 131 mg/dL (ref 0–149)
VLDL CHOLESTEROL CAL: 26 mg/dL (ref 5–40)

## 2015-10-26 NOTE — Addendum Note (Signed)
Addended by: Theodora BlowREWS, Baruch Lewers R on: 10/26/2015 04:05 PM   Modules accepted: Orders

## 2015-10-29 ENCOUNTER — Encounter: Payer: Self-pay | Admitting: Family Medicine

## 2015-10-29 ENCOUNTER — Ambulatory Visit (INDEPENDENT_AMBULATORY_CARE_PROVIDER_SITE_OTHER): Payer: Medicare Other | Admitting: Family Medicine

## 2015-10-29 VITALS — BP 114/84 | Ht 72.0 in | Wt 215.4 lb

## 2015-10-29 DIAGNOSIS — L03314 Cellulitis of groin: Secondary | ICD-10-CM | POA: Diagnosis not present

## 2015-10-29 MED ORDER — HYDROCODONE-ACETAMINOPHEN 10-325 MG PO TABS: 1.0000 | ORAL_TABLET | ORAL | Status: DC | PRN

## 2015-10-29 MED ORDER — CIPROFLOXACIN HCL 500 MG PO TABS: 500.0000 mg | ORAL_TABLET | Freq: Two times a day (BID) | ORAL | Status: DC

## 2015-10-29 NOTE — Progress Notes (Signed)
   Subjective:    Patient ID: Travis Schmidt, male    DOB: 01/04/1946, 70 y.o.   MRN: [id number]  HPI Patient is here today for a recheck of cellulitis in groin area.  Patient states that the cellulitis has worsened. Area is very painful and swollen. Currently taking doxycycline.   patient relates increased pain discomfort denies fever chills sweats nausea vomiting has never had this before Patient has no other concerns at this time.  Review of Systems     Objective:   Physical Exam  what appears to be a groin abscess. Having difficult time with the tenderness there there is mid fluctuance wears several days ago it was just firm does appear to be draining in one spot       Assessment & Plan:   abscess add cipro twice a day i spoke with urology who will see the patient tomorrow morning patient will need i&d   pain medication given caution drowsiness for home use only

## 2015-12-18 ENCOUNTER — Encounter: Payer: Self-pay | Admitting: Family Medicine

## 2015-12-18 LAB — BASIC METABOLIC PANEL
BUN / CREAT RATIO: 16 (ref 10–24)
BUN: 20 mg/dL (ref 8–27)
CO2: 22 mmol/L (ref 18–29)
CREATININE: 1.26 mg/dL (ref 0.76–1.27)
Calcium: 9.1 mg/dL (ref 8.6–10.2)
Chloride: 105 mmol/L (ref 96–106)
GFR, EST AFRICAN AMERICAN: 66 mL/min/{1.73_m2} (ref >59)
GFR, EST NON AFRICAN AMERICAN: 57 mL/min/{1.73_m2} — AB (ref >59)
Glucose: 106 mg/dL — ABNORMAL HIGH (ref 65–99)
POTASSIUM: 4.4 mmol/L (ref 3.5–5.2)
SODIUM: 142 mmol/L (ref 134–144)

## 2016-03-13 ENCOUNTER — Other Ambulatory Visit: Payer: Self-pay | Admitting: Family Medicine

## 2016-04-04 ENCOUNTER — Other Ambulatory Visit: Payer: Self-pay | Admitting: Family Medicine

## 2016-06-09 ENCOUNTER — Other Ambulatory Visit: Payer: Self-pay | Admitting: Family Medicine

## 2016-06-13 ENCOUNTER — Ambulatory Visit (INDEPENDENT_AMBULATORY_CARE_PROVIDER_SITE_OTHER): Payer: Medicare Other | Admitting: Family Medicine

## 2016-06-13 DIAGNOSIS — L03312 Cellulitis of back [any part except buttock]: Secondary | ICD-10-CM

## 2016-06-13 MED ORDER — CEPHALEXIN 500 MG PO CAPS: 500.0000 mg | ORAL_CAPSULE | Freq: Four times a day (QID) | ORAL | 0 refills | Status: DC

## 2016-06-13 NOTE — Progress Notes (Signed)
This gentleman had a skin lesion removed a proximally 3 days ago he relates he's been unable to change her dressing. Relates some pain and discomfort. Denies any E per chills sweats. Denies any breathing difficulties. On visual exam he has an area approximately 3/4 inch in diameter that's been removed to the dermis layer. This particular area would be best served by being on antibiotic. Also recommend antibiotic ointment and a nonstick patch he will follow-up in the few days to have this changed

## 2016-06-16 ENCOUNTER — Ambulatory Visit (INDEPENDENT_AMBULATORY_CARE_PROVIDER_SITE_OTHER): Payer: Medicare Other | Admitting: Family Medicine

## 2016-06-16 ENCOUNTER — Encounter: Payer: Self-pay | Admitting: Family Medicine

## 2016-06-16 VITALS — BP 122/84 | Ht 72.0 in | Wt 231.8 lb

## 2016-06-16 DIAGNOSIS — E785 Hyperlipidemia, unspecified: Secondary | ICD-10-CM

## 2016-06-16 DIAGNOSIS — Z79899 Other long term (current) drug therapy: Secondary | ICD-10-CM

## 2016-06-16 MED ORDER — MUPIROCIN 2 % EX OINT: TOPICAL_OINTMENT | CUTANEOUS | 0 refills | Status: DC

## 2016-06-16 NOTE — Progress Notes (Signed)
   Subjective:    Patient ID: Travis Schmidt, male    DOB: 09/28/1945, 71 y.o.   MRN: [id number]  HPI Patient arrives with c/o follow up on cellulitis of the back Patient had an area on his back that was concerning. Patient saw Dr. Margo AyeHall had this removed. It had some local irritation with it. He is currently on antibiotics. Review of Systems Patient denies fever chills sweats.    Objective:   Physical Exam  Lungs clear heart regular has minimal localized cellulitis dressing change was done.      Assessment & Plan:  Follow-up for dressing change in 2 days bring Bactroban ointment to use on this.  Get pathology from dermatology  Wellness exam in the near future Patient relates family history of dementia and none present currently will screen more thoroughly at wellness exam

## 2016-06-17 ENCOUNTER — Telehealth: Payer: Self-pay | Admitting: Family Medicine

## 2016-06-17 NOTE — Progress Notes (Signed)
Spoke with Dr. Scharlene GlossHall's office.  They are faxing this over.

## 2016-06-17 NOTE — Telephone Encounter (Signed)
Review dermapathology report faxed from Dr. Marcelo BaldyHalls office in results folder.

## 2016-06-18 ENCOUNTER — Ambulatory Visit: Payer: Medicare Other | Admitting: *Deleted

## 2016-06-18 DIAGNOSIS — L03312 Cellulitis of back [any part except buttock]: Secondary | ICD-10-CM

## 2016-06-18 NOTE — Progress Notes (Signed)
Nurse visit for dressing change today. Applied bactroban ointment to wound and put on dressing. Pt to come back on 1/26 for nurse visit for another dressing change.

## 2016-06-19 ENCOUNTER — Encounter: Payer: Self-pay | Admitting: Family Medicine

## 2016-06-19 LAB — BASIC METABOLIC PANEL
BUN/Creatinine Ratio: 15 (ref 10–24)
BUN: 18 mg/dL (ref 8–27)
CALCIUM: 8.6 mg/dL (ref 8.6–10.2)
CO2: 25 mmol/L (ref 18–29)
CREATININE: 1.17 mg/dL (ref 0.76–1.27)
Chloride: 102 mmol/L (ref 96–106)
GFR, EST AFRICAN AMERICAN: 72 mL/min/{1.73_m2} (ref >59)
GFR, EST NON AFRICAN AMERICAN: 62 mL/min/{1.73_m2} (ref >59)
Glucose: 103 mg/dL — ABNORMAL HIGH (ref 65–99)
POTASSIUM: 4.5 mmol/L (ref 3.5–5.2)
Sodium: 141 mmol/L (ref 134–144)

## 2016-06-19 LAB — HEPATIC FUNCTION PANEL
ALBUMIN: 4.1 g/dL (ref 3.5–4.8)
ALT: 21 IU/L (ref 0–44)
AST: 21 IU/L (ref 0–40)
Alkaline Phosphatase: 82 IU/L (ref 39–117)
BILIRUBIN TOTAL: 0.6 mg/dL (ref 0.0–1.2)
Bilirubin, Direct: 0.14 mg/dL (ref 0.00–0.40)
Total Protein: 6.4 g/dL (ref 6.0–8.5)

## 2016-06-19 LAB — LIPID PANEL
CHOLESTEROL TOTAL: 157 mg/dL (ref 100–199)
Chol/HDL Ratio: 3.7 ratio units (ref 0.0–5.0)
HDL: 42 mg/dL (ref >39)
LDL CALC: 93 mg/dL (ref 0–99)
Triglycerides: 109 mg/dL (ref 0–149)
VLDL Cholesterol Cal: 22 mg/dL (ref 5–40)

## 2016-06-19 NOTE — Telephone Encounter (Signed)
Notified patient Dr. Lorin PicketScott saw path report- basal cell cancer- edges were free of cancer- should be fine with no further surgery needed- follow up with derm as requested by them. Patient verbalized understanding.

## 2016-06-19 NOTE — Telephone Encounter (Signed)
Let pt know I saw path report- basal cell cancer- edges were free of cancer- should be fine with no further surgery needed- follow up with derm as requested by them(pt knows that but reinforce it)

## 2016-06-20 ENCOUNTER — Other Ambulatory Visit: Payer: Medicare Other | Admitting: *Deleted

## 2016-06-20 ENCOUNTER — Other Ambulatory Visit: Payer: Medicare Other

## 2016-06-20 DIAGNOSIS — L03312 Cellulitis of back [any part except buttock]: Secondary | ICD-10-CM

## 2016-06-20 NOTE — Progress Notes (Unsigned)
Pt arrives for a nurse visit for a dressing change. Pt lives alone and cannot change dressing himself because it is on back. Cleaned area with saline and applied bactroban ointment. Covered with non stick pad gauge. Pt to come back on Monday for another dressing change.

## 2016-06-23 ENCOUNTER — Other Ambulatory Visit: Payer: Medicare Other

## 2016-06-23 NOTE — Progress Notes (Signed)
Bactroban ointment applied. Dressing change completed without difficulty.

## 2016-06-26 ENCOUNTER — Ambulatory Visit: Payer: Medicare Other | Admitting: *Deleted

## 2016-06-26 DIAGNOSIS — L03312 Cellulitis of back [any part except buttock]: Secondary | ICD-10-CM | POA: Insufficient documentation

## 2016-06-26 NOTE — Progress Notes (Addendum)
Nurse visit for dressing change today. Dressing removed and checked by Dr Lorin PicketScott. No further dressing changes necessary follow up for physical 07/09/16. Patient verbalized understanding.

## 2016-07-04 ENCOUNTER — Other Ambulatory Visit: Payer: Self-pay | Admitting: Family Medicine

## 2016-07-09 ENCOUNTER — Encounter: Payer: Self-pay | Admitting: Family Medicine

## 2016-07-09 ENCOUNTER — Ambulatory Visit (INDEPENDENT_AMBULATORY_CARE_PROVIDER_SITE_OTHER): Payer: Medicare Other | Admitting: Family Medicine

## 2016-07-09 VITALS — BP 134/78 | Ht 70.25 in | Wt 226.0 lb

## 2016-07-09 DIAGNOSIS — I10 Essential (primary) hypertension: Secondary | ICD-10-CM | POA: Diagnosis not present

## 2016-07-09 DIAGNOSIS — E785 Hyperlipidemia, unspecified: Secondary | ICD-10-CM | POA: Diagnosis not present

## 2016-07-09 DIAGNOSIS — Z0000 Encounter for general adult medical examination without abnormal findings: Secondary | ICD-10-CM

## 2016-07-09 MED ORDER — ATORVASTATIN CALCIUM 80 MG PO TABS: 80.0000 mg | ORAL_TABLET | Freq: Every day | ORAL | 2 refills | Status: DC

## 2016-07-09 MED ORDER — METOPROLOL SUCCINATE ER 50 MG PO TB24: 50.0000 mg | ORAL_TABLET | Freq: Every day | ORAL | 2 refills | Status: DC

## 2016-07-09 NOTE — Progress Notes (Signed)
Subjective:    Patient ID: Travis Schmidt, male    DOB: 04-Sep-1945, 71 y.o.   MRN: [id number]  HPI  AWV- Annual Wellness Visit  The patient was seen for their annual wellness visit. The patient's past medical history, surgical history, and family history were reviewed. Pertinent vaccines were reviewed ( tetanus, pneumonia, shingles, flu) The patient's medication list was reviewed and updated.  The height and weight were entered. The patient's current BMI is:  Cognitive screening was completed. Outcome of Mini - Cog: Pass  Falls within the past 6 months None   Current tobacco usage: Yes  (All patients who use tobacco were given written and verbal information on quitting)  Recent listing of emergency department/hospitalizations over the past year were reviewed.  current specialist the patient sees on a regular basis: Dermatology Dr.Hal   Medicare annual wellness visit patient questionnaire was reviewed.  A written screening schedule for the patient for the next 5-10 years was given. Appropriate discussion of followup regarding next visit was discussed.   Patient would like to discuss vitamin supplementation. We had a long discussion regarding vitamin supplementation patient does not do a good job eating vegetables we talked about the importance of trying taking in a multivitamin along with 400 units of vitamin D  Patient does take his blood pressure medicine tries watch his diet he does not drink any alcohol  Patient was taught to at length regarding cholesterol. Does take his cholesterol medicine. Recent lab work from January was discussed in detail liver function look good cholesterol overall looks good kidney function also looked good Review of Systems  Constitutional: Negative for activity change, appetite change and fever.  HENT: Negative for congestion and rhinorrhea.   Eyes: Negative for discharge.  Respiratory: Negative for cough and wheezing.   Cardiovascular:  Negative for chest pain.  Gastrointestinal: Negative for abdominal pain, blood in stool and vomiting.  Genitourinary: Negative for difficulty urinating and frequency.  Musculoskeletal: Negative for neck pain.  Skin: Negative for rash.  Allergic/Immunologic: Negative for environmental allergies and food allergies.  Neurological: Negative for weakness and headaches.  Psychiatric/Behavioral: Negative for agitation.       Objective:   Physical Exam  Constitutional: He appears well-developed and well-nourished.  HENT:  Head: Normocephalic and atraumatic.  Right Ear: External ear normal.  Left Ear: External ear normal.  Nose: Nose normal.  Mouth/Throat: Oropharynx is clear and moist.  Eyes: EOM are normal. Pupils are equal, round, and reactive to light.  Neck: Normal range of motion. Neck supple. No thyromegaly present.  Cardiovascular: Normal rate, regular rhythm and normal heart sounds.   No murmur heard. Pulmonary/Chest: Effort normal and breath sounds normal. No respiratory distress. He has no wheezes.  Abdominal: Soft. Bowel sounds are normal. He exhibits no distension and no mass. There is no tenderness.  Genitourinary: Penis normal.  Musculoskeletal: Normal range of motion. He exhibits no edema.  Lymphadenopathy:    He has no cervical adenopathy.  Neurological: He is alert. He exhibits normal muscle tone.  Skin: Skin is warm and dry. No erythema.  Psychiatric: He has a normal mood and affect. His behavior is normal. Judgment normal.   The patient defers on prostate exam Patient defers on PSA Patient defers on colonoscopy He is aware of the preventative measures and why they are recommended he just chooses not to have that done because he states he does not feel like he would do anything about it should find anything  Assessment & Plan:  Adult wellness-complete.wellness physical was conducted today. Importance of diet and exercise were discussed in detail. In addition  to this a discussion regarding safety was also covered. We also reviewed over immunizations and gave recommendations regarding current immunization needed for age. In addition to this additional areas were also touched on including: Preventative health exams needed: Colonoscopy patient has chosen not to get colonoscopy  Patient was advised yearly wellness exam  Hyperlipidemia refills given follow-up in 6 months watch diet lab work was reviewed with patient watch diet stay active  Blood pressure good control refills given watch diet closely stay active  Patient is looking at moving to the [facility] if so he will get established with the doctor

## 2016-07-14 ENCOUNTER — Ambulatory Visit (HOSPITAL_COMMUNITY)
Admission: RE | Admit: 2016-07-14 | Discharge: 2016-07-14 | Disposition: A | Discharge status: Home / Self Care | Payer: Medicare Other | Source: Ambulatory Visit | Attending: Family Medicine | Admitting: Family Medicine

## 2016-07-14 ENCOUNTER — Encounter: Payer: Self-pay | Admitting: Family Medicine

## 2016-07-14 ENCOUNTER — Ambulatory Visit (INDEPENDENT_AMBULATORY_CARE_PROVIDER_SITE_OTHER): Payer: Medicare Other | Admitting: Family Medicine

## 2016-07-14 VITALS — Temp 98.1°F | Ht 70.25 in | Wt 226.0 lb

## 2016-07-14 DIAGNOSIS — R062 Wheezing: Secondary | ICD-10-CM

## 2016-07-14 DIAGNOSIS — R05 Cough: Secondary | ICD-10-CM | POA: Diagnosis present

## 2016-07-14 DIAGNOSIS — J189 Pneumonia, unspecified organism: Secondary | ICD-10-CM

## 2016-07-14 DIAGNOSIS — J209 Acute bronchitis, unspecified: Secondary | ICD-10-CM | POA: Insufficient documentation

## 2016-07-14 MED ORDER — DOXYCYCLINE HYCLATE 100 MG PO CAPS: 100.0000 mg | ORAL_CAPSULE | Freq: Two times a day (BID) | ORAL | 0 refills | Status: DC

## 2016-07-14 MED ORDER — ALBUTEROL SULFATE HFA 108 (90 BASE) MCG/ACT IN AERS: 2.0000 | INHALATION_SPRAY | RESPIRATORY_TRACT | 0 refills | Status: AC | PRN

## 2016-07-14 MED ORDER — PREDNISONE 20 MG PO TABS: ORAL_TABLET | ORAL | 0 refills | Status: DC

## 2016-07-14 NOTE — Progress Notes (Signed)
   Subjective:    Patient ID: Travis Schmidt A First II, male    DOB: 06/17/1945, 71 y.o.   MRN: [id number]  Cough  This is a new problem. The current episode started in the past 7 days. Associated symptoms include nasal congestion, a sore throat and wheezing.   Patient relates head congestion drainage coughing not feeling good past few days now having chest congestion coughing wheezing and shortness of breath relates no sweats no high fever chills. Relates the wheezing was bad enough to where he had a hard time breathing last night.   Review of Systems  HENT: Positive for sore throat.   Respiratory: Positive for cough and wheezing.    Patient denies vomiting diarrhea fever chills    Objective:   Physical Exam HEENT is benign bilateral expiratory wheezes heart regular extremities no edema skin warm  a nebulizer treatment was given which showed improvement in the wheezing       Assessment & Plan:  Viral syndrome Secondary bronchitis Early pneumonia Oxygen saturation 95% after nebulizer treatment Significant reactive airway nebulizer treatment was given with significant improvement recommend prednisone taper and albuterol on a regular basis until improving Technically patient has what is more than likely walking pneumonia should get better on antibiotics prednisone and inhalers warning signs were discussed follow-up if ongoing troubles Chest x-ray did not show pneumonia patient was spoken with

## 2016-07-24 ENCOUNTER — Encounter: Payer: Self-pay | Admitting: Family Medicine

## 2016-07-24 ENCOUNTER — Ambulatory Visit (INDEPENDENT_AMBULATORY_CARE_PROVIDER_SITE_OTHER): Payer: Medicare Other | Admitting: Family Medicine

## 2016-07-24 VITALS — BP 130/70 | Ht 70.25 in | Wt 229.5 lb

## 2016-07-24 DIAGNOSIS — J209 Acute bronchitis, unspecified: Secondary | ICD-10-CM | POA: Diagnosis not present

## 2016-07-24 MED ORDER — VALACYCLOVIR HCL 500 MG PO TABS: 500.0000 mg | ORAL_TABLET | Freq: Two times a day (BID) | ORAL | 12 refills | Status: DC

## 2016-07-24 NOTE — Progress Notes (Signed)
   Subjective:    Patient ID: Travis Schmidt, male    DOB: 12/02/1945, 71 y.o.   MRN: [id number]  HPI Patient is here today for a recheck on his pneumonia. Patient states that he is feeling a lot better. Wheezing has completely resolved. Patient has no other concerns at this time.  Patient was recently seen for walking pneumonia and complicated chest congestion he denies any high fever chills currently states his breathing is doing better denies sweats chills vomiting  Review of Systems Please see above    Objective:   Physical Exam Bilateral lung exam normal heart regular pulse normal       Assessment & Plan:  Recent pneumonia resolved no further testing necessary  I did discuss with him his x-ray showing some early signs of COPD I've encouraged him to quit smoking cigars also recommend that if he starts noticing shortness of breath to follow-up for further testing

## 2016-12-03 ENCOUNTER — Telehealth: Payer: Self-pay | Admitting: Family Medicine

## 2016-12-03 NOTE — Telephone Encounter (Signed)
Left message to return call 

## 2016-12-03 NOTE — Telephone Encounter (Signed)
The best place to start first is here in if we detect the problem I can help set him up with the right neurologist

## 2016-12-03 NOTE — Telephone Encounter (Signed)
Patient called and said he is interested in getting a cognitive function test.  He wants to know if we can do this at the office or if he needs to go to neurology?

## 2016-12-03 NOTE — Telephone Encounter (Signed)
Discussed with pt. Pt verbalized understanding. Transferred to front to schedule office visit  

## 2016-12-10 NOTE — Patient Instructions (Signed)
Travis Schmidt  12/10/2016     @PREFPERIOPPHARMACY @   Your procedure is scheduled on 12/16/2016.  Report to [facility] at 8:00 A.M.  Call this number if you have problems the morning of surgery:  [phone]   Remember:  Do not eat food or drink liquids after midnight.  Take these medicines the morning of surgery with A SIP OF WATER Albuterol inhaler and bring with you, Xanax, Lexapro, Seroquel   Do not wear jewelry, make-up or nail polish.  Do not wear lotions, powders, or perfumes, or deoderant.  Do not shave 48 hours prior to surgery.  Men may shave face and neck.  Do not bring valuables to the hospital.  [facility] is not responsible for any belongings or valuables.  Contacts, dentures or bridgework may not be worn into surgery.  Leave your suitcase in the car.  After surgery it may be brought to your room.  For patients admitted to the hospital, discharge time will be determined by your treatment team.  Patients discharged the day of surgery will not be allowed to drive home.    Please read over the following fact sheets that you were given. Anesthesia Post-op Instructions     PATIENT INSTRUCTIONS POST-ANESTHESIA  IMMEDIATELY FOLLOWING SURGERY:  Do not drive or operate machinery for the first twenty four hours after surgery.  Do not make any important decisions for twenty four hours after surgery or while taking narcotic pain medications or sedatives.  If you develop intractable nausea and vomiting or a severe headache please notify your doctor immediately.  FOLLOW-UP:  Please make an appointment with your surgeon as instructed. You do not need to follow up with anesthesia unless specifically instructed to do so.  WOUND CARE INSTRUCTIONS (if applicable):  Keep a dry clean dressing on the anesthesia/puncture wound site if there is drainage.  Once the wound has quit draining you may leave it open to air.  Generally you should leave the bandage intact for twenty four  hours unless there is drainage.  If the epidural site drains for more than 36-48 hours please call the anesthesia department.  QUESTIONS?:  Please feel free to call your physician or the hospital operator if you have any questions, and they will be happy to assist you.      Cataract Surgery Cataract surgery is a procedure to remove a cataract from your eye. A cataract is cloudiness on the lens of your eye. The lens focuses light inside the eye. When a lens becomes cloudy, your vision is affected. Cataract surgery is a procedure to remove the cloudy lens. A substitute lens (intraocular lens or IOL) is usually inserted as a replacement for the cloudy lens. Tell a health care provider about:  Any allergies you have.  All medicines you are taking, including vitamins, herbs, eye drops, creams, and over-the-counter medicines.  Any problems you or family members have had with anesthetic medicines.  Any blood disorders you have.  Any surgeries you have had, especially eye surgeries that include refractive surgery, such as PRK and LASIK.  Any medical conditions you have.  Whether you are pregnant or may be pregnant. What are the risks? Generally, this is a safe procedure. However, problems may occur, including:  Infection.  Bleeding.  Glaucoma.  Retinal detachment.  Allergic reactions to medicines.  Damage to other structures or organs.  Inflammation of the eye.  Clouding of the part of your eye that holds an IOL in place (after-cataract), if  an IOL was inserted. This is fairly common.  An IOL moving out of position, if an IOL was inserted. This is very rare.  Loss of vision. This is rare.  What happens before the procedure?  Follow instructions from your health care provider about eating or drinking restrictions.  Ask your health care provider about: ? Changing or stopping your regular medicines, including any eye drops you have been prescribed. This is especially important  if you are taking diabetes medicines or blood thinners. ? Taking medicines such as aspirin and ibuprofen. These medicines can thin your blood. Do not take these medicines before your procedure if your health care provider instructs you not to.  Do not put contact lenses in either eye on the day of your surgery.  Plan for someone to drive you to and from the procedure.  If you will be going home right after the procedure, plan to have someone with you for 24 hours. What happens during the procedure?  An IV tube may be inserted into one of your veins.  You will be given one or more of the following: ? A medicine to help you relax (sedative). ? A medicine to numb the area (local anesthetic). This may be numbing eye drops or an injection that is given behind the eye.  A small cut (incision) will be made to the edge of the clear, dome-shaped surface that covers the front of the eye (cornea).  A small probe will be inserted into the eye. This device gives off ultrasound waves that soften and break up the cloudy center of the lens. This makes it easier for the cloudy lens to be removed by suction.  An IOL may be implanted.  Part of the capsule that surrounds the lens will be left in the eye to support the IOL.  Your surgeon may use stitches (sutures) to close the incision. The procedure may vary among health care providers and hospitals. What happens after the procedure?  Your blood pressure, heart rate, breathing rate, and blood oxygen level will be monitored often until the medicines you were given have worn off.  You may be given a protective shield to wear over your eyes.  Do not drive for 24 hours if you received a sedative. This information is not intended to replace advice given to you by your health care provider. Make sure you discuss any questions you have with your health care provider. Document Released: 05/01/2011 Document Revised: 10/18/2015 Document Reviewed:  03/22/2015 Elsevier Interactive Patient Education  2017 [employer]

## 2016-12-11 ENCOUNTER — Other Ambulatory Visit: Payer: Self-pay

## 2016-12-11 ENCOUNTER — Encounter (HOSPITAL_COMMUNITY)
Admission: RE | Admit: 2016-12-11 | Discharge: 2016-12-11 | Disposition: A | Discharge status: Home / Self Care | Payer: Medicare Other | Source: Ambulatory Visit | Attending: Ophthalmology | Admitting: Ophthalmology

## 2016-12-11 ENCOUNTER — Encounter (HOSPITAL_COMMUNITY): Payer: Self-pay

## 2016-12-11 DIAGNOSIS — H269 Unspecified cataract: Secondary | ICD-10-CM | POA: Insufficient documentation

## 2016-12-11 DIAGNOSIS — Z01812 Encounter for preprocedural laboratory examination: Secondary | ICD-10-CM | POA: Insufficient documentation

## 2016-12-11 HISTORY — DX: Unspecified osteoarthritis, unspecified site: M19.90

## 2016-12-11 LAB — CBC
HCT: 42 % (ref 39.0–52.0)
HEMOGLOBIN: 14.5 g/dL (ref 13.0–17.0)
MCH: 32 pg (ref 26.0–34.0)
MCHC: 34.5 g/dL (ref 30.0–36.0)
MCV: 92.7 fL (ref 78.0–100.0)
PLATELETS: 140 10*3/uL — AB (ref 150–400)
RBC: 4.53 MIL/uL (ref 4.22–5.81)
RDW: 13.1 % (ref 11.5–15.5)
WBC: 6.1 10*3/uL (ref 4.0–10.5)

## 2016-12-11 LAB — BASIC METABOLIC PANEL
Anion gap: 9 (ref 5–15)
BUN: 9 mg/dL (ref 6–20)
CALCIUM: 9.2 mg/dL (ref 8.9–10.3)
CO2: 25 mmol/L (ref 22–32)
CREATININE: 1.16 mg/dL (ref 0.61–1.24)
Chloride: 108 mmol/L (ref 101–111)
Glucose, Bld: 100 mg/dL — ABNORMAL HIGH (ref 65–99)
Potassium: 3.9 mmol/L (ref 3.5–5.1)
SODIUM: 142 mmol/L (ref 135–145)

## 2016-12-16 ENCOUNTER — Encounter (HOSPITAL_COMMUNITY)
Admission: RE | Disposition: A | Discharge status: Home / Self Care | Payer: Self-pay | Source: Ambulatory Visit | Attending: Ophthalmology

## 2016-12-16 ENCOUNTER — Ambulatory Visit (HOSPITAL_COMMUNITY): Payer: Medicare Other | Admitting: Anesthesiology

## 2016-12-16 ENCOUNTER — Ambulatory Visit (HOSPITAL_COMMUNITY)
Admission: RE | Admit: 2016-12-16 | Discharge: 2016-12-16 | Disposition: A | Discharge status: Home / Self Care | Payer: Medicare Other | Source: Ambulatory Visit | Attending: Ophthalmology | Admitting: Ophthalmology

## 2016-12-16 DIAGNOSIS — Z880 Allergy status to penicillin: Secondary | ICD-10-CM | POA: Insufficient documentation

## 2016-12-16 DIAGNOSIS — Z885 Allergy status to narcotic agent status: Secondary | ICD-10-CM | POA: Insufficient documentation

## 2016-12-16 DIAGNOSIS — I10 Essential (primary) hypertension: Secondary | ICD-10-CM | POA: Insufficient documentation

## 2016-12-16 DIAGNOSIS — Z87891 Personal history of nicotine dependence: Secondary | ICD-10-CM | POA: Insufficient documentation

## 2016-12-16 DIAGNOSIS — H2511 Age-related nuclear cataract, right eye: Secondary | ICD-10-CM | POA: Diagnosis present

## 2016-12-16 DIAGNOSIS — F329 Major depressive disorder, single episode, unspecified: Secondary | ICD-10-CM | POA: Insufficient documentation

## 2016-12-16 DIAGNOSIS — Z79899 Other long term (current) drug therapy: Secondary | ICD-10-CM | POA: Insufficient documentation

## 2016-12-16 HISTORY — PX: CATARACT EXTRACTION W/PHACO: SHX586

## 2016-12-16 SURGERY — PHACOEMULSIFICATION, CATARACT, WITH IOL INSERTION
Anesthesia: Monitor Anesthesia Care | Site: Eye | Laterality: Right

## 2016-12-16 MED ORDER — LACTATED RINGERS IV SOLN
INTRAVENOUS | Status: DC
  Administered 2016-12-16: 09:00:00 via INTRAVENOUS

## 2016-12-16 MED ORDER — TETRACAINE HCL 0.5 % OP SOLN
1.0000 [drp] | OPHTHALMIC | Status: AC
  Administered 2016-12-16 (×3): 1 [drp] via OPHTHALMIC

## 2016-12-16 MED ORDER — MIDAZOLAM HCL 2 MG/2ML IJ SOLN
1.0000 mg | INTRAMUSCULAR | Status: AC
  Administered 2016-12-16 (×2): 1 mg via INTRAVENOUS

## 2016-12-16 MED ORDER — FENTANYL CITRATE (PF) 100 MCG/2ML IJ SOLN
INTRAMUSCULAR | Status: AC
  Filled 2016-12-16: qty 2

## 2016-12-16 MED ORDER — TETRACAINE 0.5 % OP SOLN OPTIME - NO CHARGE
OPHTHALMIC | Status: DC | PRN
  Administered 2016-12-16: 2 [drp] via OPHTHALMIC

## 2016-12-16 MED ORDER — EPINEPHRINE PF 1 MG/ML IJ SOLN
INTRAMUSCULAR | Status: DC | PRN
  Administered 2016-12-16: 500 mL

## 2016-12-16 MED ORDER — PHENYLEPHRINE HCL 2.5 % OP SOLN
1.0000 [drp] | OPHTHALMIC | Status: AC
  Administered 2016-12-16 (×3): 1 [drp] via OPHTHALMIC

## 2016-12-16 MED ORDER — KETOROLAC TROMETHAMINE 0.5 % OP SOLN
1.0000 [drp] | OPHTHALMIC | Status: AC
  Administered 2016-12-16 (×3): 1 [drp] via OPHTHALMIC

## 2016-12-16 MED ORDER — MIDAZOLAM HCL 2 MG/2ML IJ SOLN
INTRAMUSCULAR | Status: AC
  Filled 2016-12-16: qty 2

## 2016-12-16 MED ORDER — FENTANYL CITRATE (PF) 100 MCG/2ML IJ SOLN
25.0000 ug | Freq: Once | INTRAMUSCULAR | Status: AC
  Administered 2016-12-16: 25 ug via INTRAVENOUS

## 2016-12-16 MED ORDER — BSS IO SOLN
INTRAOCULAR | Status: DC | PRN
Start: 2016-12-16 — End: 2016-12-16
  Administered 2016-12-16: 15 mL

## 2016-12-16 MED ORDER — PROVISC 10 MG/ML IO SOLN
INTRAOCULAR | Status: DC | PRN
  Administered 2016-12-16: 0.85 mL via INTRAOCULAR

## 2016-12-16 MED ORDER — CYCLOPENTOLATE-PHENYLEPHRINE 0.2-1 % OP SOLN
1.0000 [drp] | OPHTHALMIC | Status: AC
  Administered 2016-12-16 (×3): 1 [drp] via OPHTHALMIC

## 2016-12-16 SURGICAL SUPPLY — 11 items
CLOTH BEACON ORANGE TIMEOUT ST (SAFETY) ×2 IMPLANT
EYE SHIELD UNIVERSAL CLEAR (GAUZE/BANDAGES/DRESSINGS) ×2 IMPLANT
GLOVE BIO SURGEON STRL SZ 6.5 (GLOVE) ×1 IMPLANT
GLOVE BIO SURGEONS STRL SZ 6.5 (GLOVE) ×1
GLOVE BIOGEL PI IND STRL 7.0 (GLOVE) IMPLANT
GLOVE BIOGEL PI INDICATOR 7.0 (GLOVE) ×2
LENS ALC ACRYL/TECN (Ophthalmic Related) ×3 IMPLANT
PAD ARMBOARD 7.5X6 YLW CONV (MISCELLANEOUS) ×2 IMPLANT
TAPE SURG TRANSPORE 1 IN (GAUZE/BANDAGES/DRESSINGS) IMPLANT
TAPE SURGICAL TRANSPORE 1 IN (GAUZE/BANDAGES/DRESSINGS) ×2
WATER STERILE IRR 250ML POUR (IV SOLUTION) ×2 IMPLANT

## 2016-12-16 NOTE — Discharge Instructions (Signed)
°  °          [facility] Instructions °[address]- [place] [address]-[place] °    ° °1. Avoid closing eyes tightly. One often closes the eye tightly when laughing, talking, sneezing, coughing or if they feel irritated. At these times, you should be careful not to close your eyes tightly. ° °2. Instill eye drops as instructed. To instill drops in your eye, open it, look up and have someone gently pull the lower lid down and instill a couple of drops inside the lower lid. ° °3. Do not touch upper lid. ° °4. Take Advil or Tylenol for pain. ° °5. You may use either eye for near work, such as reading or sewing and you may watch television. ° °6. You may have your hair done at the beauty parlor at any time. ° °7. Wear dark glasses with or without your own glasses if you are in bright light. ° °8. Call our office at [phone] or [phone] if you have sharp pain in your eye or unusual symptoms. ° °9.  FOLLOW UP WITH DR. SHAPIRO TODAY IN HIS [place] OFFICE AT 3 pm. ° °  °I have received a copy of the above instructions and will follow them.  ° ° ° °IF YOU ARE IN IMMEDIATE DANGER CALL 911! ° °It is important for you to keep your follow-up appointment with your physician after discharge, OR, for you /your caregiver to make a follow-up appointment with your physician / medical provider after discharge. ° °Show these instructions to the next healthcare provider you see. ° °

## 2016-12-16 NOTE — Anesthesia Preprocedure Evaluation (Signed)
Anesthesia Evaluation  Patient identified by MRN, date of birth, ID band Patient awake    Reviewed: Allergy & Precautions, NPO status , Patient's Chart, lab work & pertinent test results, reviewed documented beta blocker date and time   Airway Mallampati: I  TM Distance: >3 FB     Dental   Pulmonary former smoker,    breath sounds clear to auscultation       Cardiovascular hypertension, Pt. on medications and Pt. on home beta blockers  Rhythm:Regular Rate:Bradycardia     Neuro/Psych PSYCHIATRIC DISORDERS Depression    GI/Hepatic negative GI ROS, Gilbert's  Syndrome   Endo/Other  negative endocrine ROS  Renal/GU negative Renal ROS     Musculoskeletal   Abdominal   Peds  Hematology negative hematology ROS (+)   Anesthesia Other Findings   Reproductive/Obstetrics                             Anesthesia Physical Anesthesia Plan  ASA: III  Anesthesia Plan: MAC   Post-op Pain Management:    Induction: Intravenous  PONV Risk Score and Plan:   Airway Management Planned: Nasal Cannula  Additional Equipment:   Intra-op Plan:   Post-operative Plan:   Informed Consent: I have reviewed the patients History and Physical, chart, labs and discussed the procedure including the risks, benefits and alternatives for the proposed anesthesia with the patient or authorized representative who has indicated his/her understanding and acceptance.     Plan Discussed with:   Anesthesia Plan Comments:         Anesthesia Quick Evaluation

## 2016-12-16 NOTE — H&P (Signed)
The patient was re examined and there is no change in the patients condition since the original H and P. 

## 2016-12-16 NOTE — Anesthesia Postprocedure Evaluation (Signed)
Anesthesia Post Note  Patient: Travis Schmidt  Procedure(s) Performed: Procedure(s) (LRB): CATARACT EXTRACTION PHACO AND INTRAOCULAR LENS PLACEMENT (IOC) (Right)  Patient location during evaluation: Short Stay Anesthesia Type: MAC Level of consciousness: awake and alert Pain management: pain level controlled Vital Signs Assessment: post-procedure vital signs reviewed and stable Respiratory status: spontaneous breathing Cardiovascular status: stable Postop Assessment: no signs of nausea or vomiting Anesthetic complications: no     Last Vitals:  Vitals:   12/16/16 0930 12/16/16 0934  BP:    Pulse:    Resp: 19 (!) 28  Temp:      Last Pain:  Vitals:   12/16/16 0830  TempSrc: Oral                 Yumi Insalaco

## 2016-12-16 NOTE — Anesthesia Procedure Notes (Signed)
Procedure Name: MAC Date/Time: 12/16/2016 9:33 AM Performed by: Vista Deck Pre-anesthesia Checklist: Patient identified, Emergency Drugs available, Suction available, Timeout performed and Patient being monitored Patient Re-evaluated:Patient Re-evaluated prior to induction Oxygen Delivery Method: Nasal Cannula

## 2016-12-16 NOTE — Transfer of Care (Signed)
Immediate Anesthesia Transfer of Care Note  Patient: Travis Schmidt  Procedure(s) Performed: Procedure(s) (LRB): CATARACT EXTRACTION PHACO AND INTRAOCULAR LENS PLACEMENT (IOC) (Right)  Patient Location: Shortstay  Anesthesia Type: MAC  Level of Consciousness: awake  Airway & Oxygen Therapy: Patient Spontanous Breathing   Post-op Assessment: Report given to PACU RN, Post -op Vital signs reviewed and stable and Patient moving all extremities  Post vital signs: Reviewed and stable  Complications: No apparent anesthesia complications

## 2016-12-16 NOTE — Op Note (Signed)
Patient brought to the operating room and prepped and draped in the usual manner.  Lid speculum inserted in right eye.  Stab incision made at the twelve o'clock position.  Provisc instilled in the anterior chamber.   A 2.4 mm. Stab incision was made temporally.  An anterior capsulotomy was done with a bent 25 gauge needle.  The nucleus was hydrodissected.  The Phaco tip was inserted in the anterior chamber and the nucleus was emulsified.  CDE was 4.72.  The cortical material was then removed with the I and A tip.  Posterior capsule was the polished.  The anterior chamber was deepened with Provisc.  A 21.0 Diopter Alcon [id number] IOL was then inserted in the capsular bag.  Provisc was then removed with the I and A tip.  The wound was then hydrated.  Patient sent to the Recovery Room in good condition with follow up in my office.  Preoperative Diagnosis:  Nuclear and PSC Cataract OD Postoperative Diagnosis:  Same Procedure name: Kelman Phacoemulsification OD with IOL

## 2016-12-17 ENCOUNTER — Encounter (HOSPITAL_COMMUNITY): Payer: Self-pay | Admitting: Ophthalmology

## 2016-12-26 ENCOUNTER — Encounter: Payer: Self-pay | Admitting: Family Medicine

## 2016-12-26 ENCOUNTER — Ambulatory Visit (INDEPENDENT_AMBULATORY_CARE_PROVIDER_SITE_OTHER): Payer: Medicare Other | Admitting: Family Medicine

## 2016-12-26 VITALS — BP 124/70 | Ht 71.0 in | Wt 226.0 lb

## 2016-12-26 DIAGNOSIS — R413 Other amnesia: Secondary | ICD-10-CM

## 2016-12-26 NOTE — Progress Notes (Signed)
   Subjective:    Patient ID: Travis Schmidt, male    DOB: 1945-07-28, 71 y.o.   MRN: [id number]  HPIpt arrives today to have cognitive function test. Pt states his children wanted him to have test done due to family history.  Patient states that he had several times where he seemed to state the wrong name of the person he was thinking about this concerned him. Therefore his family one of them to have some cognitive testing. He denies repeating himself he denies misplacing things does not get lost handles his own finances does have family history of Alzheimer's/dementia Mini cog-patient passes 3 for 3    Review of Systems See above please negative for chest pain shortness breath nausea vomiting diarrhea high fever chills sweats    Objective:   Physical Exam Lungs clear heart regular pulse normal Mini cog past [place] cognitive assessment score is 27 out of 30  No dementia     Assessment & Plan:  No sign of cognitive dysfunction at this point long discussion held regarding further testing that could be done at this point I don't recommend If he has progressive troubles we will get him in with neurology they may have to do MRI in more extensive testing

## 2016-12-31 ENCOUNTER — Encounter (HOSPITAL_COMMUNITY)
Admission: RE | Admit: 2016-12-31 | Discharge: 2016-12-31 | Disposition: A | Discharge status: Home / Self Care | Payer: Medicare Other | Source: Ambulatory Visit | Attending: Ophthalmology | Admitting: Ophthalmology

## 2017-01-01 ENCOUNTER — Encounter (HOSPITAL_COMMUNITY): Payer: Self-pay

## 2017-01-05 ENCOUNTER — Ambulatory Visit (INDEPENDENT_AMBULATORY_CARE_PROVIDER_SITE_OTHER): Payer: Medicare Other | Admitting: Family Medicine

## 2017-01-05 ENCOUNTER — Encounter: Payer: Self-pay | Admitting: Family Medicine

## 2017-01-05 VITALS — BP 108/60 | Temp 98.1°F | Ht 71.0 in | Wt 225.0 lb

## 2017-01-05 DIAGNOSIS — J019 Acute sinusitis, unspecified: Secondary | ICD-10-CM

## 2017-01-05 DIAGNOSIS — J4521 Mild intermittent asthma with (acute) exacerbation: Secondary | ICD-10-CM

## 2017-01-05 MED ORDER — DOXYCYCLINE HYCLATE 100 MG PO TABS: 100.0000 mg | ORAL_TABLET | Freq: Two times a day (BID) | ORAL | 0 refills | Status: DC

## 2017-01-05 MED ORDER — PREDNISONE 20 MG PO TABS: ORAL_TABLET | ORAL | 0 refills | Status: DC

## 2017-01-05 NOTE — Progress Notes (Signed)
   Subjective:    Patient ID: Travis Schmidt, male    DOB: Aug 28, 1945, 71 y.o.   MRN: [id number]  Cough  This is a new problem. The current episode started in the past 7 days. The cough is productive of sputum. Pertinent negatives include no chest pain, ear pain, fever, rhinorrhea or wheezing.   No other concerns. Patient with chest congestion coughing a little bit of wheezing denies high fever chills sweats  Review of Systems  Constitutional: Negative for activity change and fever.  HENT: Negative for congestion, ear pain and rhinorrhea.   Eyes: Negative for discharge.  Respiratory: Positive for cough. Negative for wheezing.   Cardiovascular: Negative for chest pain.       Objective:   Physical Exam  Constitutional: He appears well-developed.  HENT:  Head: Normocephalic.  Mouth/Throat: Oropharynx is clear and moist. No oropharyngeal exudate.  Neck: Normal range of motion.  Cardiovascular: Normal rate, regular rhythm and normal heart sounds.   No murmur heard. Pulmonary/Chest: Effort normal and breath sounds normal. He has no wheezes.  Lymphadenopathy:    He has no cervical adenopathy.  Neurological: He exhibits normal muscle tone.  Skin: Skin is warm and dry.  Nursing note and vitals reviewed.   Patient wonders about COPD we had long discussion he does not have any shortness of breath or wheezing when he is healthy and well therefore hold off on any PFT currently certainly if he starts having COPD type symptoms we can do PFT      Assessment & Plan:  Reactive airway Acute rhinosinusitis Antibiotics prednisone albuterol when necessary Patient states he has cataract surgery tomorrow he states he's Arty tots in the hospital told them his symptoms and they stated he could go ahead and have his surgery  If he starts running fever progressive wheezing or shortness of breath he needs to be rechecked immediately possible x-rays possible lab work possible ER visit

## 2017-01-06 ENCOUNTER — Ambulatory Visit (HOSPITAL_COMMUNITY): Payer: Medicare Other | Admitting: Anesthesiology

## 2017-01-06 ENCOUNTER — Encounter (HOSPITAL_COMMUNITY)
Admission: RE | Disposition: A | Discharge status: Home / Self Care | Payer: Self-pay | Source: Ambulatory Visit | Attending: Ophthalmology

## 2017-01-06 ENCOUNTER — Encounter (HOSPITAL_COMMUNITY): Payer: Self-pay

## 2017-01-06 ENCOUNTER — Ambulatory Visit (HOSPITAL_COMMUNITY)
Admission: RE | Admit: 2017-01-06 | Discharge: 2017-01-06 | Disposition: A | Discharge status: Home / Self Care | Payer: Medicare Other | Source: Ambulatory Visit | Attending: Ophthalmology | Admitting: Ophthalmology

## 2017-01-06 DIAGNOSIS — Z880 Allergy status to penicillin: Secondary | ICD-10-CM | POA: Diagnosis not present

## 2017-01-06 DIAGNOSIS — Z79899 Other long term (current) drug therapy: Secondary | ICD-10-CM | POA: Diagnosis not present

## 2017-01-06 DIAGNOSIS — H2512 Age-related nuclear cataract, left eye: Secondary | ICD-10-CM | POA: Diagnosis not present

## 2017-01-06 DIAGNOSIS — I10 Essential (primary) hypertension: Secondary | ICD-10-CM | POA: Diagnosis not present

## 2017-01-06 DIAGNOSIS — Z87891 Personal history of nicotine dependence: Secondary | ICD-10-CM | POA: Diagnosis not present

## 2017-01-06 DIAGNOSIS — Z885 Allergy status to narcotic agent status: Secondary | ICD-10-CM | POA: Insufficient documentation

## 2017-01-06 DIAGNOSIS — F329 Major depressive disorder, single episode, unspecified: Secondary | ICD-10-CM | POA: Insufficient documentation

## 2017-01-06 HISTORY — PX: CATARACT EXTRACTION W/PHACO: SHX586

## 2017-01-06 SURGERY — PHACOEMULSIFICATION, CATARACT, WITH IOL INSERTION
Anesthesia: Monitor Anesthesia Care | Site: Eye | Laterality: Left

## 2017-01-06 MED ORDER — TETRACAINE 0.5 % OP SOLN OPTIME - NO CHARGE
OPHTHALMIC | Status: DC | PRN
  Administered 2017-01-06: 2 [drp] via OPHTHALMIC

## 2017-01-06 MED ORDER — FENTANYL CITRATE (PF) 100 MCG/2ML IJ SOLN
INTRAMUSCULAR | Status: AC
  Filled 2017-01-06: qty 2

## 2017-01-06 MED ORDER — BSS IO SOLN
INTRAOCULAR | Status: DC | PRN
  Administered 2017-01-06: 15 mL

## 2017-01-06 MED ORDER — PROVISC 10 MG/ML IO SOLN
INTRAOCULAR | Status: DC | PRN
  Administered 2017-01-06: 0.85 mL via INTRAOCULAR

## 2017-01-06 MED ORDER — MIDAZOLAM HCL 2 MG/2ML IJ SOLN
INTRAMUSCULAR | Status: AC
  Filled 2017-01-06: qty 2

## 2017-01-06 MED ORDER — TETRACAINE HCL 0.5 % OP SOLN
1.0000 [drp] | OPHTHALMIC | Status: AC
  Administered 2017-01-06 (×3): 1 [drp] via OPHTHALMIC

## 2017-01-06 MED ORDER — EPINEPHRINE PF 1 MG/ML IJ SOLN
INTRAOCULAR | Status: DC | PRN
  Administered 2017-01-06: 500 mL

## 2017-01-06 MED ORDER — FENTANYL CITRATE (PF) 100 MCG/2ML IJ SOLN
25.0000 ug | Freq: Once | INTRAMUSCULAR | Status: AC
  Administered 2017-01-06: 25 ug via INTRAVENOUS

## 2017-01-06 MED ORDER — MIDAZOLAM HCL 2 MG/2ML IJ SOLN
1.0000 mg | INTRAMUSCULAR | Status: AC
  Administered 2017-01-06: 2 mg via INTRAVENOUS

## 2017-01-06 MED ORDER — KETOROLAC TROMETHAMINE 0.5 % OP SOLN
1.0000 [drp] | OPHTHALMIC | Status: AC
  Administered 2017-01-06 (×3): 1 [drp] via OPHTHALMIC

## 2017-01-06 MED ORDER — PHENYLEPHRINE HCL 2.5 % OP SOLN
1.0000 [drp] | OPHTHALMIC | Status: AC
  Administered 2017-01-06 (×3): 1 [drp] via OPHTHALMIC

## 2017-01-06 MED ORDER — CYCLOPENTOLATE-PHENYLEPHRINE 0.2-1 % OP SOLN
1.0000 [drp] | OPHTHALMIC | Status: AC
  Administered 2017-01-06 (×3): 1 [drp] via OPHTHALMIC

## 2017-01-06 MED ORDER — LACTATED RINGERS IV SOLN
INTRAVENOUS | Status: DC
  Administered 2017-01-06: 07:00:00 via INTRAVENOUS

## 2017-01-06 SURGICAL SUPPLY — 9 items
CLOTH BEACON ORANGE TIMEOUT ST (SAFETY) ×2 IMPLANT
EYE SHIELD UNIVERSAL CLEAR (GAUZE/BANDAGES/DRESSINGS) ×2 IMPLANT
GLOVE BIOGEL PI IND STRL 7.0 (GLOVE) IMPLANT
GLOVE BIOGEL PI INDICATOR 7.0 (GLOVE) ×4
LENS ALC ACRYL/TECN (Ophthalmic Related) ×3 IMPLANT
PAD ARMBOARD 7.5X6 YLW CONV (MISCELLANEOUS) ×2 IMPLANT
TAPE SURG TRANSPORE 1 IN (GAUZE/BANDAGES/DRESSINGS) IMPLANT
TAPE SURGICAL TRANSPORE 1 IN (GAUZE/BANDAGES/DRESSINGS) ×2
WATER STERILE IRR 250ML POUR (IV SOLUTION) ×2 IMPLANT

## 2017-01-06 NOTE — Anesthesia Preprocedure Evaluation (Signed)
Anesthesia Evaluation  Patient identified by MRN, date of birth, ID band Patient awake    Reviewed: Allergy & Precautions, NPO status , Patient's Chart, lab work & pertinent test results, reviewed documented beta blocker date and time   Airway Mallampati: I  TM Distance: >3 FB     Dental   Pulmonary former smoker,    breath sounds clear to auscultation       Cardiovascular hypertension, Pt. on medications and Pt. on home beta blockers  Rhythm:Regular Rate:Bradycardia     Neuro/Psych PSYCHIATRIC DISORDERS Depression    GI/Hepatic negative GI ROS, Gilbert's  Syndrome   Endo/Other  negative endocrine ROS  Renal/GU negative Renal ROS     Musculoskeletal   Abdominal   Peds  Hematology negative hematology ROS (+)   Anesthesia Other Findings   Reproductive/Obstetrics                             Anesthesia Physical Anesthesia Plan  ASA: III  Anesthesia Plan: MAC   Post-op Pain Management:    Induction: Intravenous  PONV Risk Score and Plan:   Airway Management Planned: Nasal Cannula  Additional Equipment:   Intra-op Plan:   Post-operative Plan:   Informed Consent: I have reviewed the patients History and Physical, chart, labs and discussed the procedure including the risks, benefits and alternatives for the proposed anesthesia with the patient or authorized representative who has indicated his/her understanding and acceptance.     Plan Discussed with:   Anesthesia Plan Comments:         Anesthesia Quick Evaluation

## 2017-01-06 NOTE — Discharge Instructions (Signed)
°  °        [facility] Instructions °[address]- [place] [address] °    ° °1. Avoid closing eyes tightly. One often closes the eye tightly when laughing, talking, sneezing, coughing or if they feel irritated. At these times, you should be careful not to close your eyes tightly. ° °2. Instill eye drops as instructed. To instill drops in your eye, open it, look up and have someone gently pull the lower lid down and instill a couple of drops inside the lower lid. ° °3. Do not touch upper lid. ° °4. Take Advil or Tylenol for pain. ° °5. You may use either eye for near work, such as reading or sewing and you may watch television. ° °6. You may have your hair done at the beauty parlor at any time. ° °7. Wear dark glasses with or without your own glasses if you are in bright light. ° °8. Call our office at [phone] or [phone] if you have sharp pain in your eye or unusual symptoms. ° °9.  FOLLOW UP WITH DR. SHAPIRO TODAY IN HIS [place] OFFICE AT 2:30 °pm. ° °  °I have received a copy of the above instructions and will follow them.  ° ° ° °IF YOU ARE IN IMMEDIATE DANGER CALL 911! ° °It is important for you to keep your follow-up appointment with your physician after discharge, OR, for you /your caregiver to make a follow-up appointment with your physician / medical provider after discharge. ° °Show these instructions to the next healthcare provider you see. ° ° °Moderate Conscious Sedation, Adult, Care After °These instructions provide you with information about caring for yourself after your procedure. Your health care provider may also give you more specific instructions. Your treatment has been planned according to current medical practices, but problems sometimes occur. Call your health care provider if you have any problems or questions after your procedure. °What can I expect after the procedure? °After your procedure, it is common: °· To feel sleepy for several  hours. °· To feel clumsy and have poor balance for several hours. °· To have poor judgment for several hours. °· To vomit if you eat too soon. ° °Follow these instructions at home: °For at least 24 hours after the procedure: ° °· Do not: °? Participate in activities where you could fall or become injured. °? Drive. °? Use heavy machinery. °? Drink alcohol. °? Take sleeping pills or medicines that cause drowsiness. °? Make important decisions or sign legal documents. °? Take care of children on your own. °· Rest. °Eating and drinking °· Follow the diet recommended by your health care provider. °· If you vomit: °? Drink water, juice, or soup when you can drink without vomiting. °? Make sure you have little or no nausea before eating solid foods. °General instructions °· Have a responsible adult stay with you until you are awake and alert. °· Take over-the-counter and prescription medicines only as told by your health care provider. °· If you smoke, do not smoke without supervision. °· Keep all follow-up visits as told by your health care provider. This is important. °Contact a health care provider if: °· You keep feeling nauseous or you keep vomiting. °· You feel light-headed. °· You develop a rash. °· You have a fever. °Get help right away if: °· You have trouble breathing. °This information is not intended to replace advice given to you by your health care provider. Make sure   you discuss any questions you have with your health care provider. °Document Released: 03/02/2013 Document Revised: 10/15/2015 Document Reviewed: 09/01/2015 °Elsevier Interactive Patient Education © 2018 [employer] ° ° °

## 2017-01-06 NOTE — Transfer of Care (Signed)
Immediate Anesthesia Transfer of Care Note  Patient: Travis Schmidt  Procedure(s) Performed: Procedure(s) with comments: CATARACT EXTRACTION PHACO AND INTRAOCULAR LENS PLACEMENT (IOC) (Left) - CDE: 6.31  Patient Location: Short Stay  Anesthesia Type:MAC  Level of Consciousness: awake and patient cooperative  Airway & Oxygen Therapy: Patient Spontanous Breathing  Post-op Assessment: Report given to RN, Post -op Vital signs reviewed and stable and Patient moving all extremities  Post vital signs: Reviewed and stable  Last Vitals:  Vitals:   01/06/17 0735 01/06/17 0740  BP: 108/67 112/66  Pulse:    Resp: 16 15  Temp:    SpO2: 93% 93%    Last Pain:  Vitals:   01/06/17 0648  TempSrc: Oral         Complications: No apparent anesthesia complications

## 2017-01-06 NOTE — Op Note (Signed)
Patient brought to the operating room and prepped and draped in the usual manner.  Lid speculum inserted in left eye.  Stab incision made at the twelve o'clock position.  Provisc instilled in the anterior chamber.   A 2.4 mm. Stab incision was made temporally.  An anterior capsulotomy was done with a bent 25 gauge needle.  The nucleus was hydrodissected.  The Phaco tip was inserted in the anterior chamber and the nucleus was emulsified.  CDE was 6.21.  The cortical material was then removed with the I and A tip.  Posterior capsule was the polished.  The anterior chamber was deepened with Provisc.  A 20.5 Diopter Alcon [id number] IOL was then inserted in the capsular bag.  Provisc was then removed with the I and A tip.  The wound was then hydrated.  Patient sent to the Recovery Room in good condition with follow up in my office.  Preoperative Diagnosis:  Nuclear and PSC Cataract OS Postoperative Diagnosis:  Same Procedure name: Kelman Phacoemulsification OS with IOL

## 2017-01-06 NOTE — H&P (Signed)
The patient was re examined and there is no change in the patients condition since the original H and P. 

## 2017-01-06 NOTE — Anesthesia Postprocedure Evaluation (Signed)
Anesthesia Post Note  Patient: Travis Schmidt  Procedure(s) Performed: Procedure(s) (LRB): CATARACT EXTRACTION PHACO AND INTRAOCULAR LENS PLACEMENT (IOC) (Left)  Patient location during evaluation: Short Stay Anesthesia Type: MAC Level of consciousness: awake and alert, oriented and patient cooperative Pain management: pain level controlled Vital Signs Assessment: post-procedure vital signs reviewed and stable Respiratory status: spontaneous breathing, nonlabored ventilation and respiratory function stable Cardiovascular status: blood pressure returned to baseline Postop Assessment: no signs of nausea or vomiting Anesthetic complications: no     Last Vitals:  Vitals:   01/06/17 0735 01/06/17 0740  BP: 108/67 112/66  Pulse:    Resp: 16 15  Temp:    SpO2: 93% 93%    Last Pain:  Vitals:   01/06/17 0648  TempSrc: Oral                 Pearley Millington J

## 2017-01-07 ENCOUNTER — Encounter (HOSPITAL_COMMUNITY): Payer: Self-pay | Admitting: Ophthalmology

## 2017-01-19 ENCOUNTER — Other Ambulatory Visit: Payer: Self-pay | Admitting: Family Medicine

## 2017-01-28 ENCOUNTER — Encounter: Payer: Self-pay | Admitting: Family Medicine

## 2017-01-28 ENCOUNTER — Ambulatory Visit (INDEPENDENT_AMBULATORY_CARE_PROVIDER_SITE_OTHER): Payer: Medicare Other | Admitting: Family Medicine

## 2017-01-28 VITALS — BP 110/72 | Ht 71.0 in | Wt 220.1 lb

## 2017-01-28 DIAGNOSIS — R600 Localized edema: Secondary | ICD-10-CM

## 2017-01-28 DIAGNOSIS — I8001 Phlebitis and thrombophlebitis of superficial vessels of right lower extremity: Secondary | ICD-10-CM | POA: Diagnosis not present

## 2017-01-28 NOTE — Progress Notes (Signed)
   Subjective:    Patient ID: Travis Schmidt, male    DOB: July 21, 1945, 71 y.o.   MRN: [id number]  Rash    Patient is here today complains of an area on right leg,thats red and sore(like a bruise to touch) it is warm to touch. It has been there for three weeks now and is not going away.Has not taken any medications for this.  Patient recalls no injury. He's worried about potential for clots. Does not smoke quit last year.  No history of DVT  Has had some swelling and tenderness in the right calf.  Region somewhat tender and sore.  No shortness of breath no dips dyspnea no chest pain  Review of Systems  Skin: Positive for rash.       Objective:   Physical Exam Alert and oriented, vitals reviewed and stable, NAD ENT-TM's and ext canals WNL bilat via otoscopic exam Soft palate, tonsils and post pharynx WNL via oropharyngeal exam Neck-symmetric, no masses; thyroid nonpalpable and nontender Pulmonary-no tachypnea or accessory muscle use; Clear without wheezes via auscultation Card--no abnrml murmurs, rhythm reg and rate WNL Carotid pulses symmetric, without bruits Calf reveals tender linear region which feels like a superficial thrombophlebitis       Assessment & Plan:  Impression probable superficial thrombophlebitis plan discussed with patient. D-dimer plus ultrasound. Symptom care for vein discussed. Depending on presence of DVT and/or more extensive superficial thrombophlebitis may need interventional medications. Discussed.

## 2017-01-28 NOTE — Patient Instructions (Signed)
Take two aleave twice per day for the next wk. Or two ibuprofen twice per day for the discomfort and inflammation

## 2017-01-29 ENCOUNTER — Other Ambulatory Visit (HOSPITAL_COMMUNITY)
Admission: RE | Admit: 2017-01-29 | Discharge: 2017-01-29 | Disposition: A | Discharge status: Home / Self Care | Payer: Medicare Other | Source: Ambulatory Visit | Attending: Family Medicine | Admitting: Family Medicine

## 2017-01-29 ENCOUNTER — Telehealth: Payer: Self-pay | Admitting: *Deleted

## 2017-01-29 ENCOUNTER — Ambulatory Visit (HOSPITAL_COMMUNITY)
Admission: RE | Admit: 2017-01-29 | Discharge: 2017-01-29 | Disposition: A | Discharge status: Home / Self Care | Payer: Medicare Other | Source: Ambulatory Visit | Attending: Family Medicine | Admitting: Family Medicine

## 2017-01-29 DIAGNOSIS — I83891 Varicose veins of right lower extremities with other complications: Secondary | ICD-10-CM | POA: Diagnosis not present

## 2017-01-29 DIAGNOSIS — R600 Localized edema: Secondary | ICD-10-CM | POA: Insufficient documentation

## 2017-01-29 LAB — D-DIMER, QUANTITATIVE: D-Dimer, Quant: 1.52 ug/mL-FEU — ABNORMAL HIGH (ref 0.00–0.50)

## 2017-01-29 NOTE — Telephone Encounter (Signed)
Dr. Brett CanalessTeve ordered d dimer and ultrasound for today. Please review results.

## 2017-01-29 NOTE — Telephone Encounter (Signed)
Please let the patient know that his ultrasound showed superficial thrombophlebitis but did not show any deep blood clot. It is very important for him to do warm compresses on this area 20 minutes at a time several times per day. He can certainly still walk around. By protocol it is also recommended to do a follow-up ultrasound on Monday with a follow-up office visit on Tuesday there is a possibility that this blood clot could turn into a deep blood clot that would require anticoagulant. Currently no anticoagulants are necessary. His blood test showed elevated d-dimer which also goes along with this clinical picture.

## 2017-01-30 ENCOUNTER — Other Ambulatory Visit: Payer: Self-pay | Admitting: *Deleted

## 2017-01-30 DIAGNOSIS — M79604 Pain in right leg: Secondary | ICD-10-CM

## 2017-01-30 DIAGNOSIS — R7989 Other specified abnormal findings of blood chemistry: Secondary | ICD-10-CM

## 2017-01-30 NOTE — Telephone Encounter (Signed)
Discussed with pt. Pt verbalized understanding. Repeat us scheduled aph sept 10th. appt scheduled with dr Lorin Picketscott same day at 3 to get results. Pt states he cannot come on Tuesday because he is moving. Ok to have appt on Monday per dr Lorin Picketscott

## 2017-02-02 ENCOUNTER — Ambulatory Visit (HOSPITAL_COMMUNITY)
Admission: RE | Admit: 2017-02-02 | Discharge: 2017-02-02 | Disposition: A | Discharge status: Home / Self Care | Payer: Medicare Other | Source: Ambulatory Visit | Attending: Family Medicine | Admitting: Family Medicine

## 2017-02-02 ENCOUNTER — Ambulatory Visit (INDEPENDENT_AMBULATORY_CARE_PROVIDER_SITE_OTHER): Payer: Medicare Other | Admitting: Family Medicine

## 2017-02-02 VITALS — BP 130/82 | Ht 71.0 in | Wt 220.0 lb

## 2017-02-02 DIAGNOSIS — Z23 Encounter for immunization: Secondary | ICD-10-CM | POA: Diagnosis not present

## 2017-02-02 DIAGNOSIS — I8001 Phlebitis and thrombophlebitis of superficial vessels of right lower extremity: Secondary | ICD-10-CM

## 2017-02-02 DIAGNOSIS — R7989 Other specified abnormal findings of blood chemistry: Secondary | ICD-10-CM | POA: Diagnosis present

## 2017-02-02 DIAGNOSIS — M79604 Pain in right leg: Secondary | ICD-10-CM | POA: Diagnosis present

## 2017-02-02 DIAGNOSIS — I8391 Asymptomatic varicose veins of right lower extremity: Secondary | ICD-10-CM | POA: Insufficient documentation

## 2017-02-02 NOTE — Progress Notes (Signed)
   Subjective:    Patient ID: Travis Schmidt, male    DOB: Dec 17, 1945, 71 y.o.   MRN: [id number]  HPI Patient arrives for a follow up of right leg pain and swelling This patient relates follow-up on his right leg he was treated and diagnosed with a superficial thrombophlebitis an elevated d-dimer no wheezing or difficulty breathing no high fever chills sweats no vomiting or diarrhea. PMH benign does take aspirin 3 days a week has been using anti-inflammatory over the past several days since being seen  Review of Systems  Constitutional: Negative for activity change, fatigue and fever.  Respiratory: Negative for cough and shortness of breath.   Cardiovascular: Negative for chest pain and leg swelling.  Neurological: Negative for headaches.       Objective:   Physical Exam  Lungs clear heart rate or pulse normal lower leg nontender calf superficial thrombophlebitis redness is going away tenderness going away      Assessment & Plan:  Superficial thrombophlebitis-stable. No sign of DVT. Follow-up test looks good.  Long discussion held with patient regarding cholesterol blood pressure he will notify us when [place] we will send his prescriptions they are  He will work on establishing himself with the new physician in that region within the next 90 days  If he needs our help he will let us now

## 2017-02-27 ENCOUNTER — Ambulatory Visit: Payer: Medicare Other | Admitting: Family Medicine

## 2017-03-11 ENCOUNTER — Other Ambulatory Visit: Payer: Self-pay | Admitting: *Deleted

## 2017-03-11 MED ORDER — METOPROLOL SUCCINATE ER 50 MG PO TB24: 50.0000 mg | ORAL_TABLET | Freq: Every day | ORAL | 1 refills | Status: DC

## 2017-07-03 ENCOUNTER — Other Ambulatory Visit: Payer: Self-pay | Admitting: Family Medicine

## 2017-09-02 ENCOUNTER — Other Ambulatory Visit: Payer: Self-pay | Admitting: Family Medicine

## 2017-10-07 ENCOUNTER — Other Ambulatory Visit: Payer: Self-pay | Admitting: Family Medicine

## 2018-07-05 IMAGING — US US EXTREM LOW VENOUS*R*
1 series · 13 of 24 positions shown · non-contrast
Comparison: None.

CLINICAL DATA: Right leg edema



[Series 1: us extrem low venous*right* · 0.08mm/px · 13 of 44 slices shown]
[im 1/44]
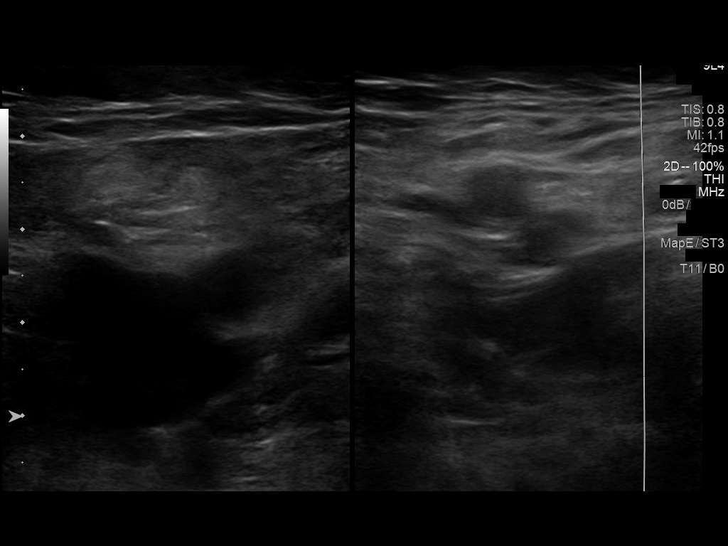
[im 4/44]
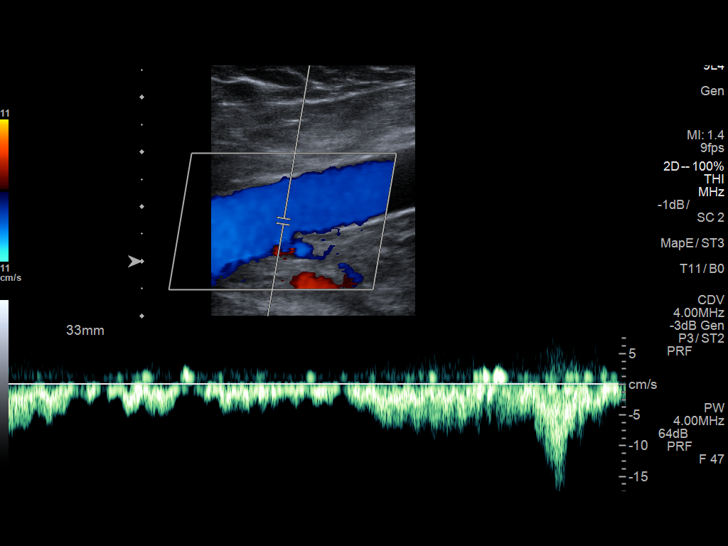
[im 8/44]
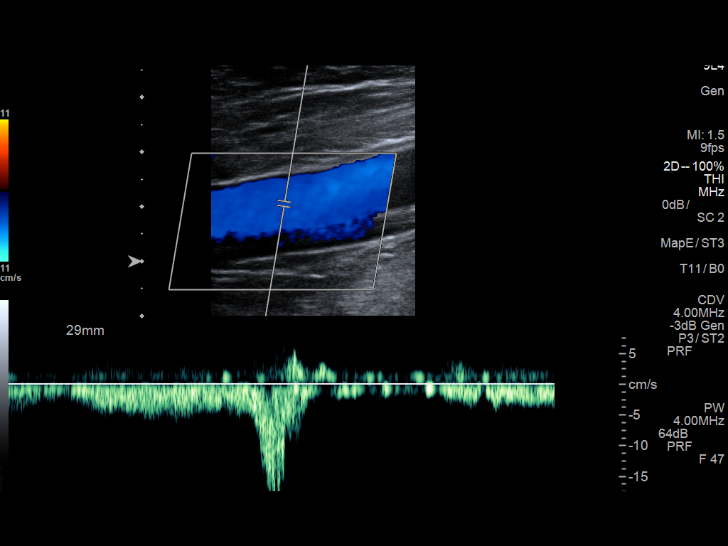
[im 12/44]
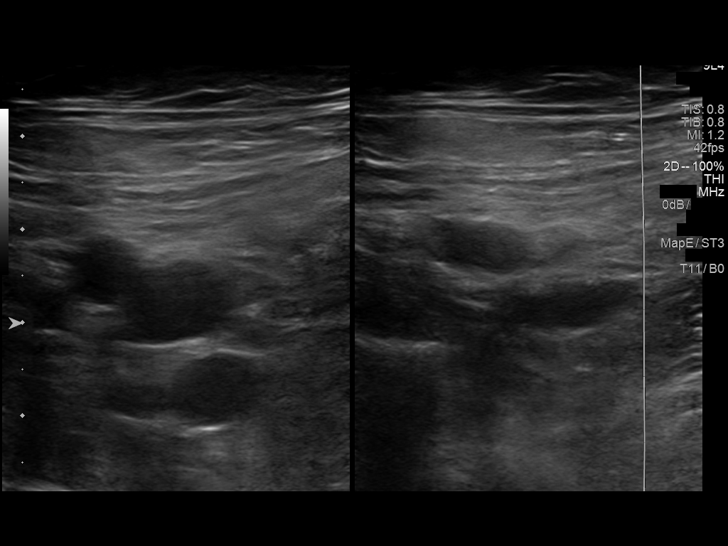
[im 15/44]
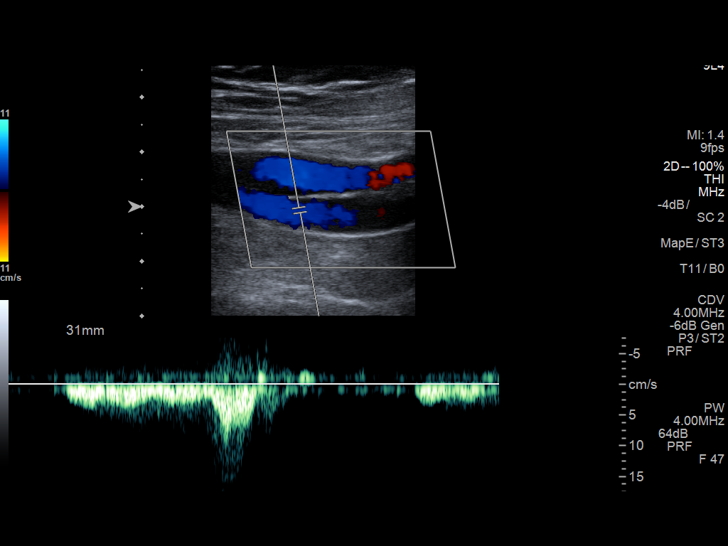
[im 19/44]
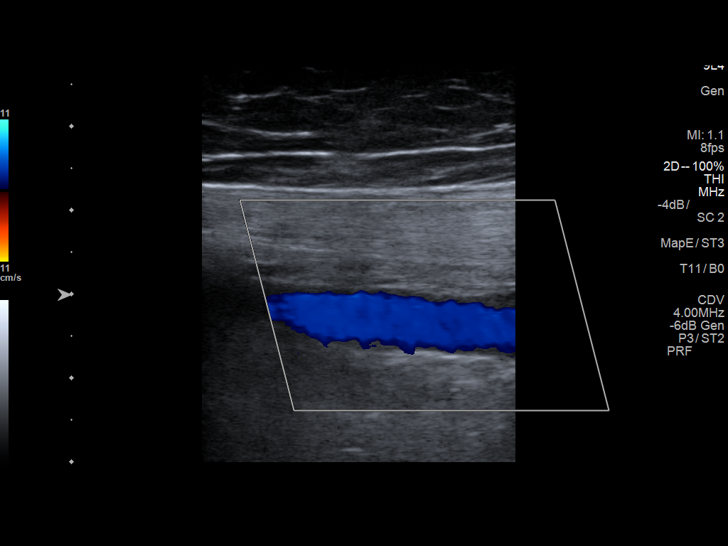
[im 23/44]
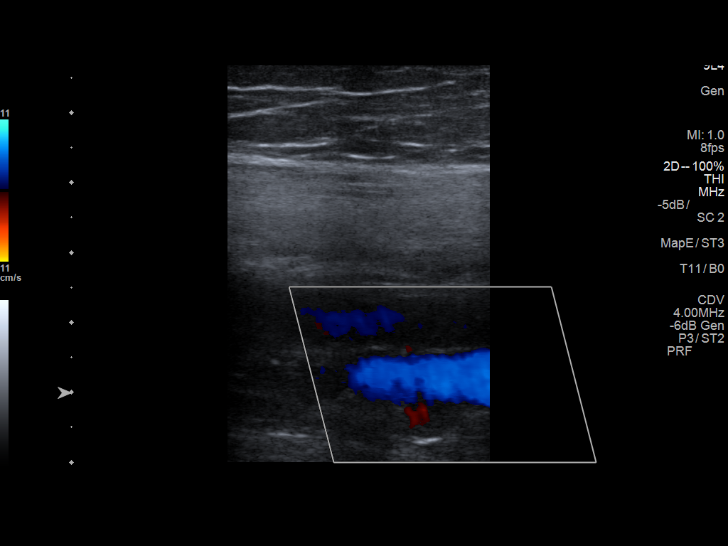
[im 25/44]
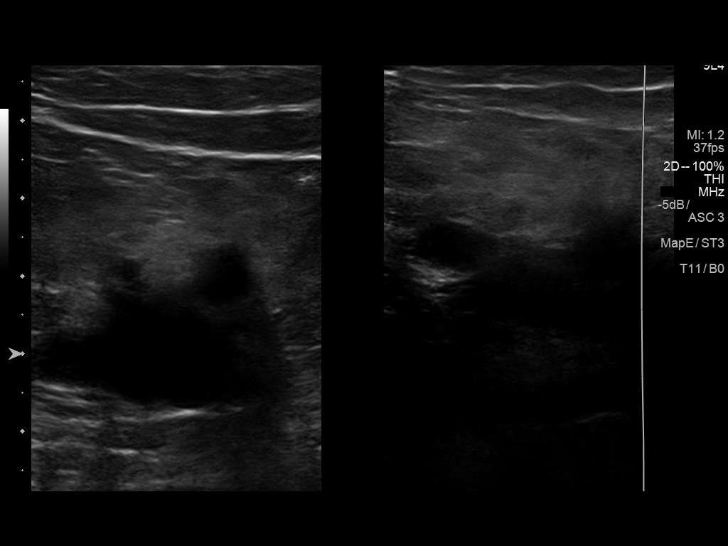
[im 29/44]
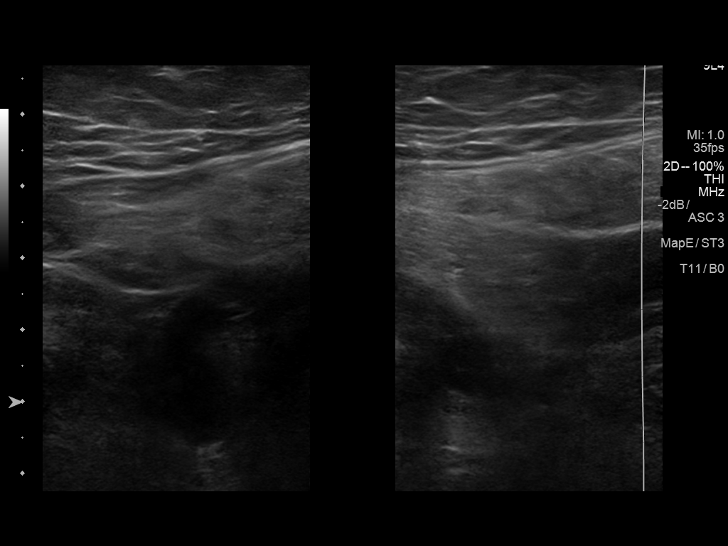
[im 32/44]
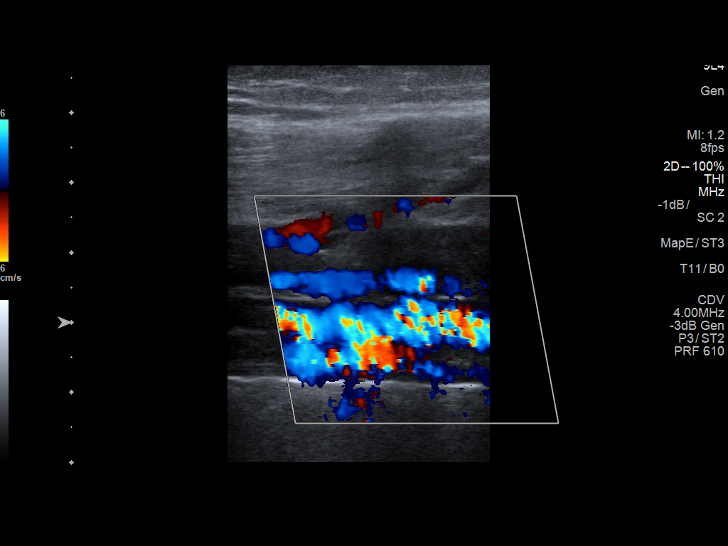
[im 36/44]
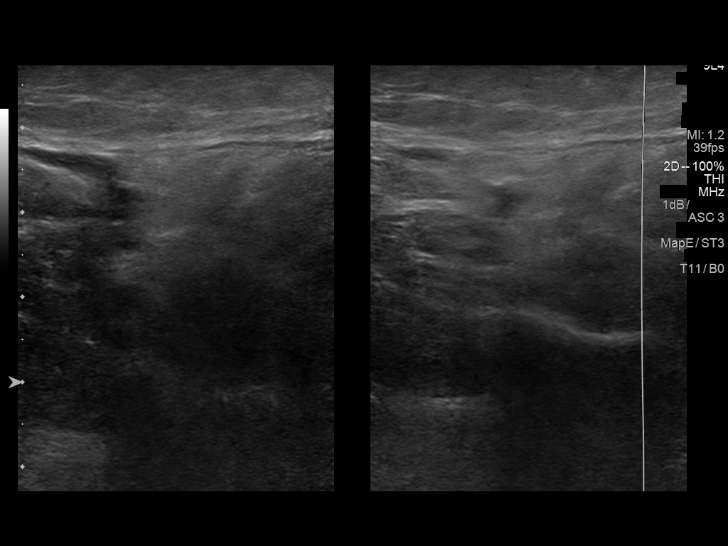
[im 40/44]
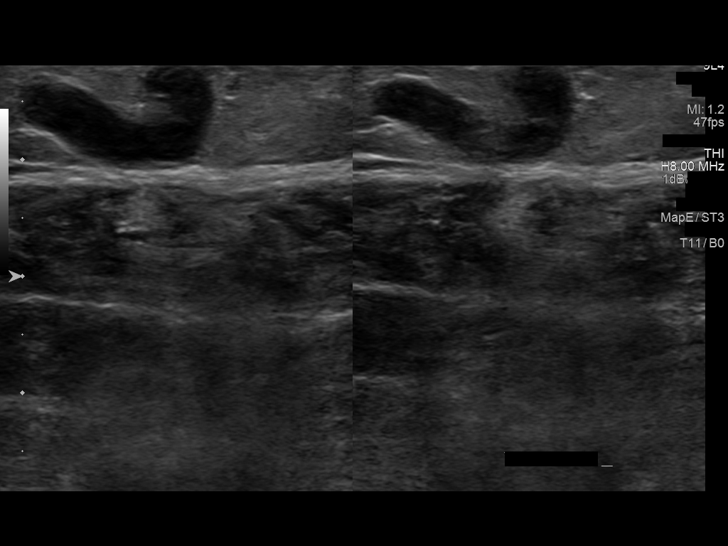
[im 44/44]
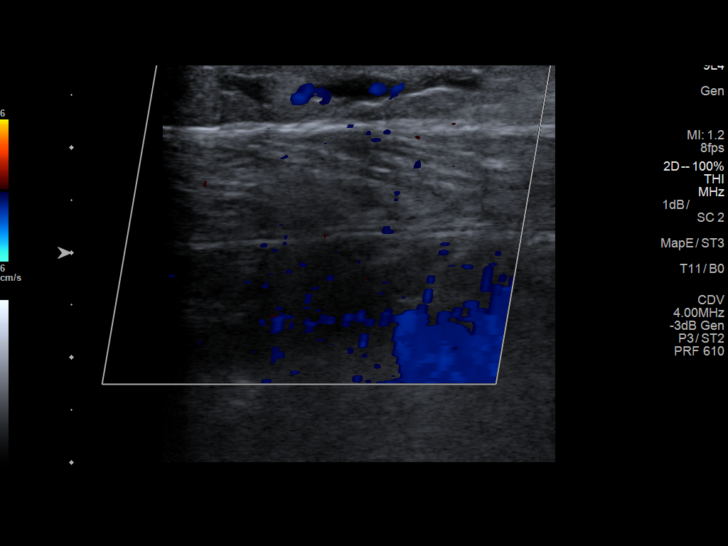

[13 of 24 positions shown; findings below may reference images not displayed]

FINDINGS: Contralateral Common Femoral Vein: Respiratory phasicity is normal
and symmetric with the symptomatic side. No evidence of thrombus.
Normal compressibility.

Common Femoral Vein: No evidence of thrombus. Normal
compressibility, respiratory phasicity and response to augmentation.

Saphenofemoral Junction: No evidence of thrombus. Normal
compressibility and flow on color Doppler imaging.

Profunda Femoral Vein: No evidence of thrombus. Normal
compressibility and flow on color Doppler imaging.

Femoral Vein: No evidence of thrombus. Normal compressibility,
respiratory phasicity and response to augmentation.

Popliteal Vein: No evidence of thrombus. Normal compressibility,
respiratory phasicity and response to augmentation.

Calf Veins: No evidence of thrombus. Normal compressibility and flow
on color Doppler imaging.

Superficial Great Saphenous Vein: No evidence of thrombus. Normal
compressibility and flow on color Doppler imaging.

Venous Reflux:  None.

Other Findings: A superficial varicose vein in the calf is occluded
consistent with thrombophlebitis.
IMPRESSION: No evidence of DVT within the right lower extremity.

There is a superficial varicose vein in the right calf which is
occluded consistent with thrombophlebitis.

These results will be called to the ordering clinician or
representative by the Radiologist Assistant, and communication
documented in the PACS or zVision Dashboard.

## 2018-07-09 IMAGING — US US EXTREM LOW VENOUS*R*
1 series · 13 of 24 positions shown · non-contrast
Comparison: January 29, 2017

CLINICAL DATA: Pain and edema with positive D-dimer study

EXAM:
RIGHT LOWER EXTREMITY VENOUS DUPLEX ULTRASOUND
TECHNIQUE: Gray-scale sonography with graded compression, as well as color
Doppler and duplex ultrasound were performed to evaluate the right
lower extremity deep venous system from the level of the common
femoral vein and including the common femoral, femoral, profunda
femoral, popliteal and calf veins including the posterior tibial,
peroneal and gastrocnemius veins when visible. The superficial great
saphenous vein was also interrogated. Spectral Doppler was utilized
to evaluate flow at rest and with distal augmentation maneuvers in
the common femoral, femoral and popliteal veins.

[Series 1: us extrem low venous*right* · 0.08mm/px · 13 of 41 slices shown]
[im 1/41]
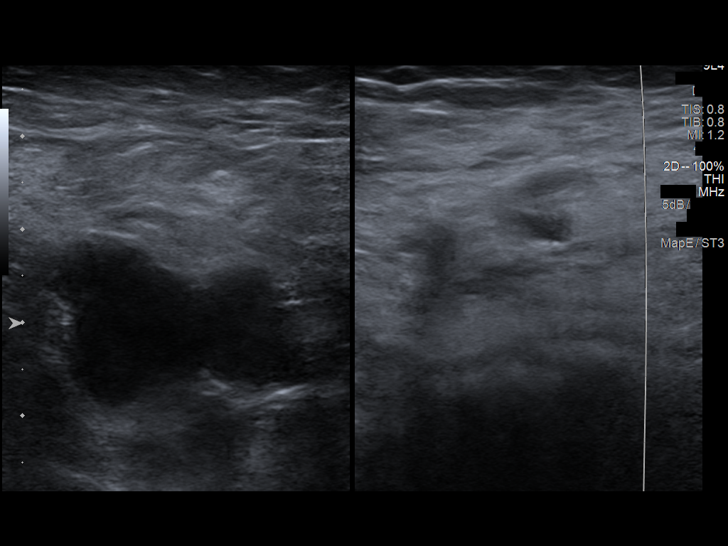
[im 4/41]
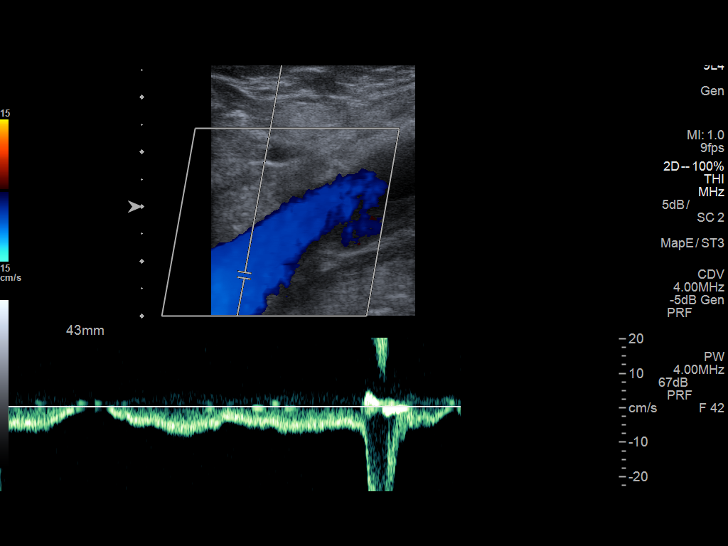
[im 7/41]
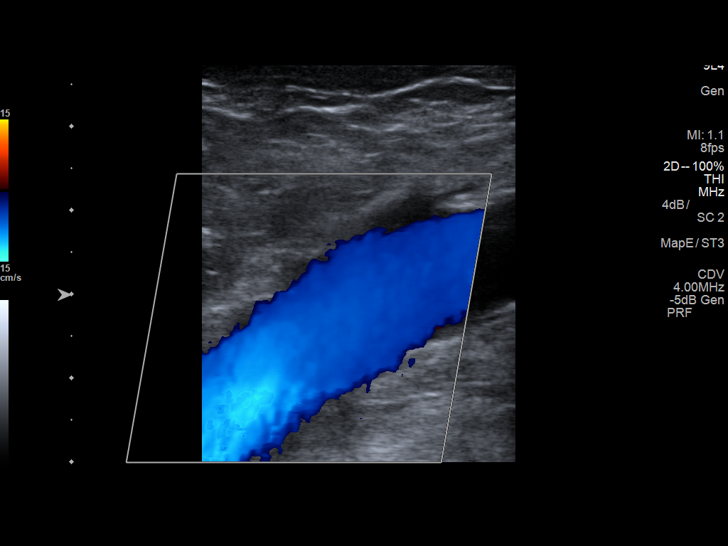
[im 11/41]
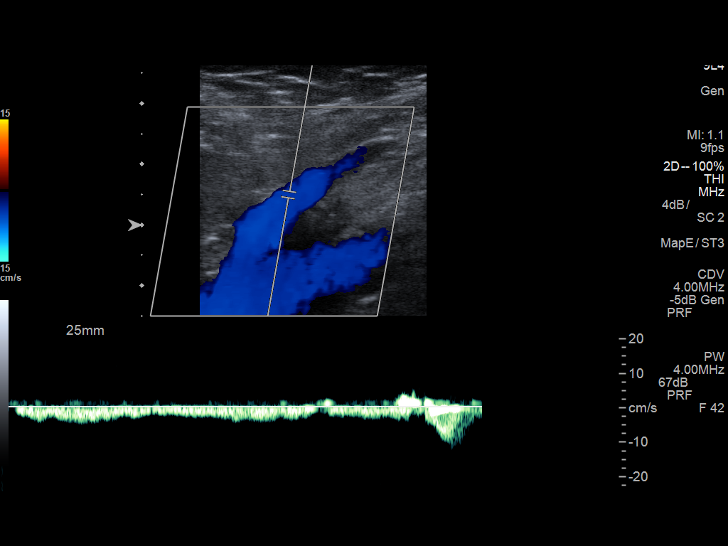
[im 14/41]
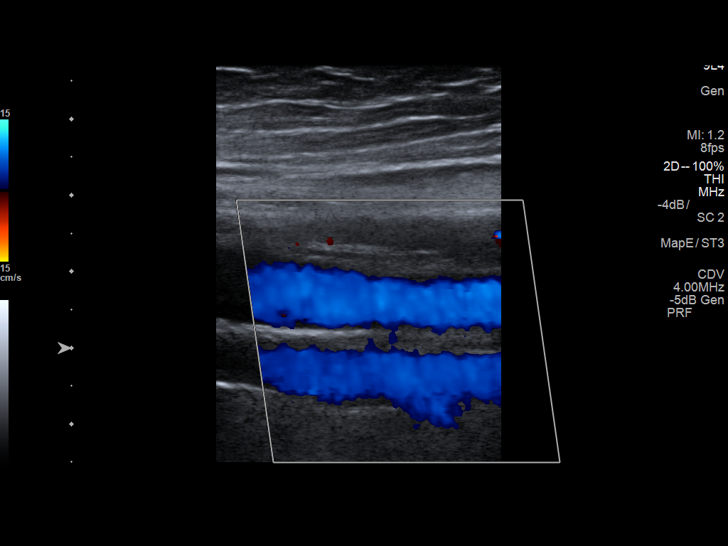
[im 18/41]
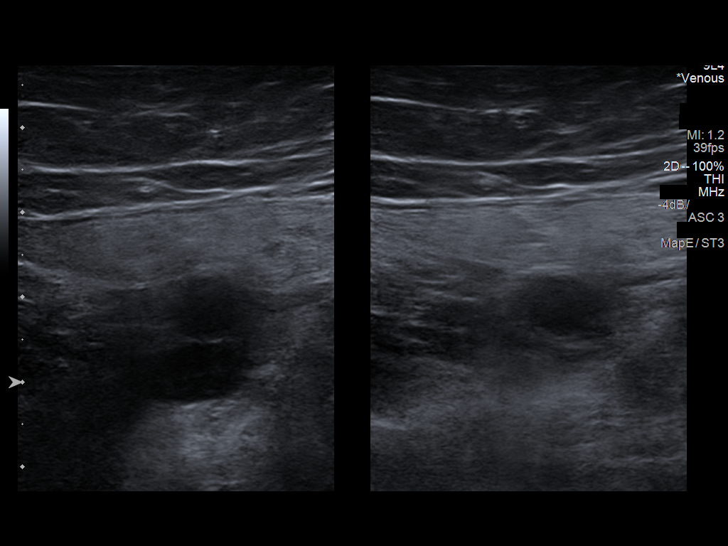
[im 21/41]
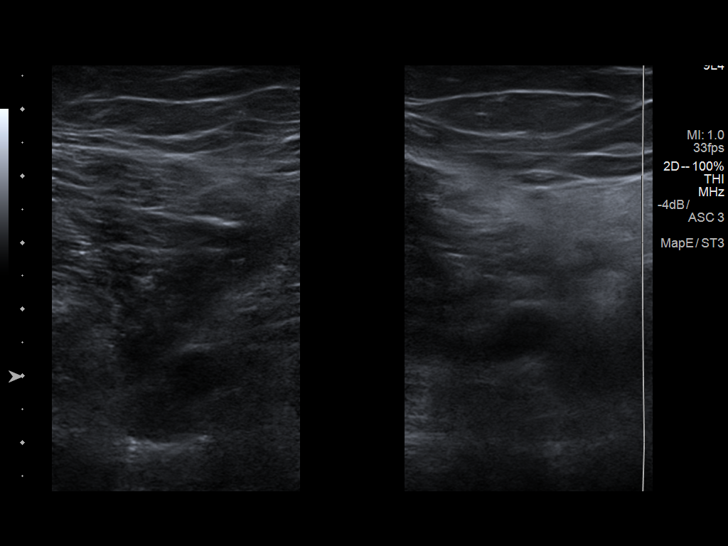
[im 23/41]
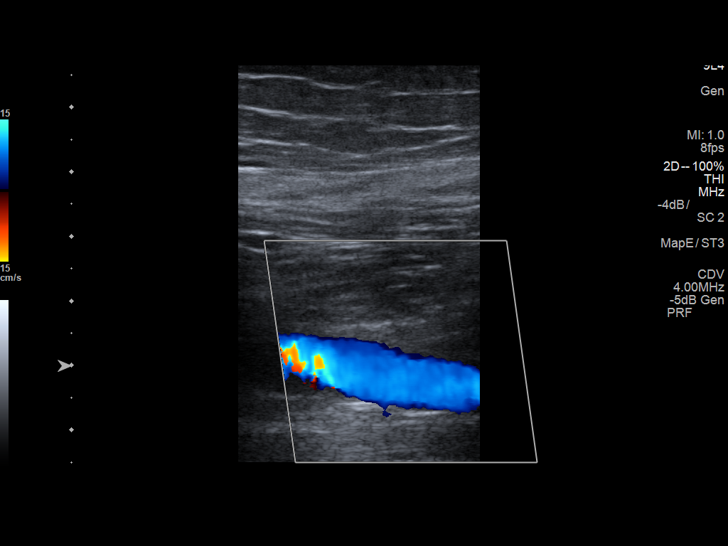
[im 27/41]
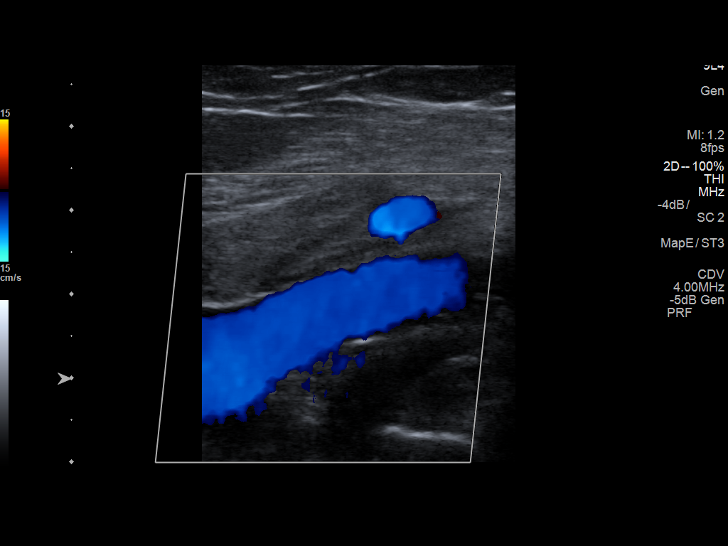
[im 30/41]
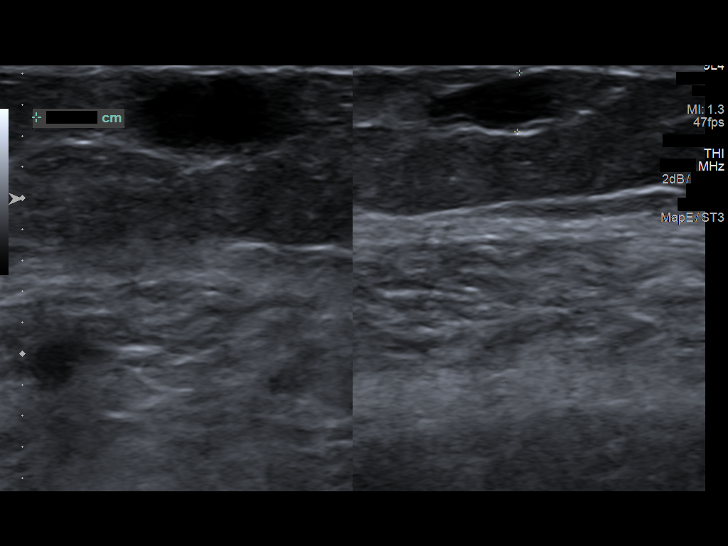
[im 34/41]
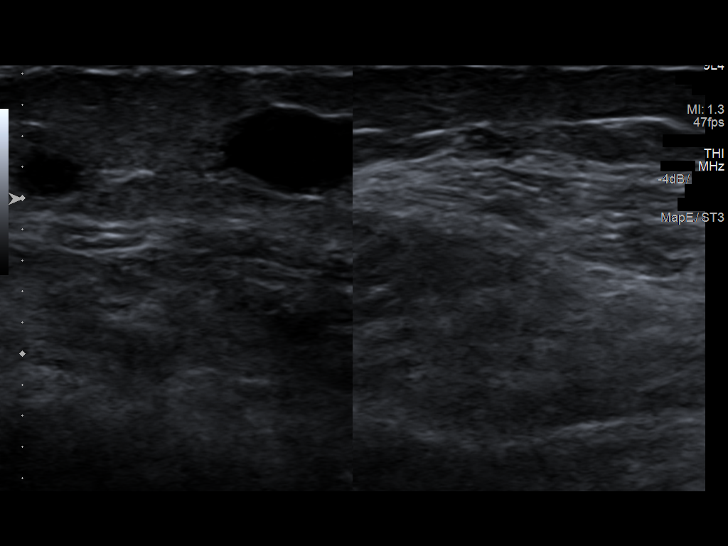
[im 37/41]
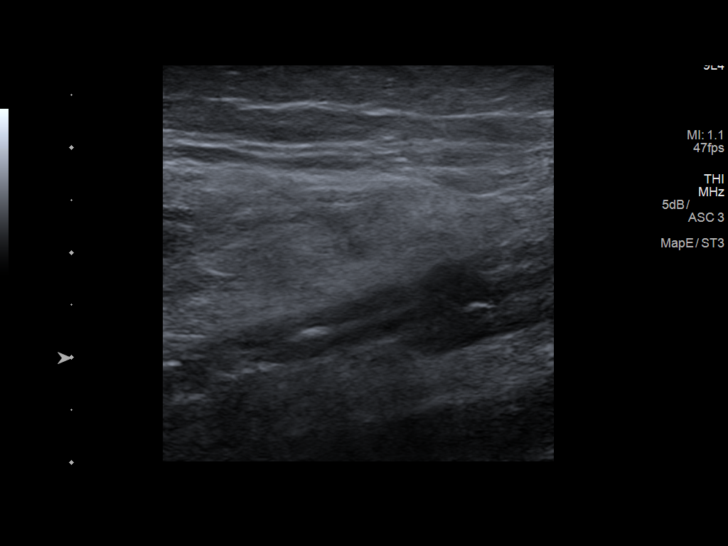
[im 41/41]
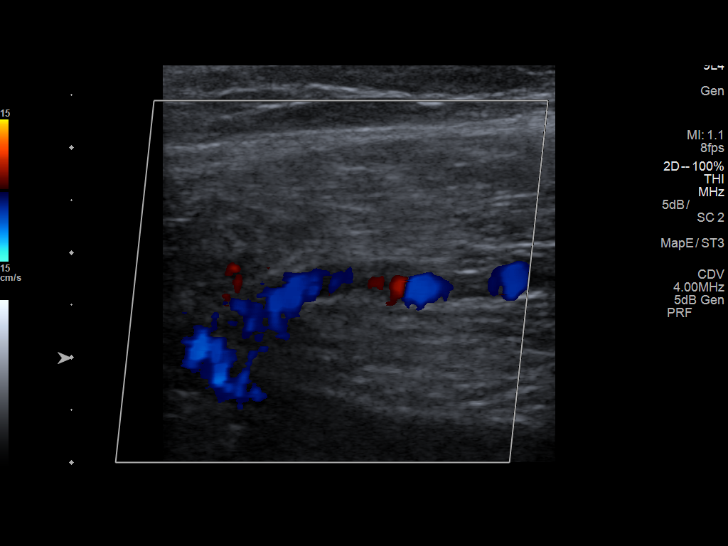

[13 of 24 positions shown; findings below may reference images not displayed]

FINDINGS: Contralateral Common Femoral Vein: Respiratory phasicity is normal
and symmetric with the symptomatic side. No evidence of thrombus.
Normal compressibility.

Common Femoral Vein: No evidence of thrombus. Normal
compressibility, respiratory phasicity and response to augmentation.

Saphenofemoral Junction: No evidence of thrombus. Normal
compressibility and flow on color Doppler imaging.

Profunda Femoral Vein: No evidence of thrombus. Normal
compressibility and flow on color Doppler imaging.

Femoral Vein: No evidence of thrombus. Normal compressibility,
respiratory phasicity and response to augmentation.

Popliteal Vein: No evidence of thrombus. Normal compressibility,
respiratory phasicity and response to augmentation.

Calf Veins: No evidence of thrombus. Normal compressibility and flow
on color Doppler imaging.

Superficial Great Saphenous Vein: No evidence of thrombus. Normal
compressibility and flow on color Doppler imaging.

Venous Reflux:  None.

Other Findings: There are superficial venous thrombi in the upper to
mid calf region. This appearance is similar to recent study.
IMPRESSION: There is no demonstrable right lower extremity deep venous
thrombosis. Note that there are superficial calf vein from causing
variceal dilatation. These superficial venous thrombi calf region
appear acute. Left common femoral vein patent.

## 2020-09-27 ENCOUNTER — Other Ambulatory Visit: Payer: Self-pay | Admitting: Family Medicine
